# Patient Record
Sex: Female | Born: 1935 | Race: White | Hispanic: No | Smoking: Never smoker
Health system: Southern US, Community
[De-identification: ages and names within clinical notes are randomized; demographics above are authoritative.]

## PROBLEM LIST (undated history)

## (undated) DIAGNOSIS — M1990 Unspecified osteoarthritis, unspecified site: Secondary | ICD-10-CM

## (undated) HISTORY — PX: EYE SURGERY: SHX253

## (undated) HISTORY — PX: ABDOMINAL HYSTERECTOMY: SHX81

## (undated) HISTORY — PX: TOTAL HIP ARTHROPLASTY: SHX124

---

## 2015-11-11 ENCOUNTER — Encounter (HOSPITAL_COMMUNITY): Payer: Self-pay

## 2015-11-11 ENCOUNTER — Emergency Department (HOSPITAL_COMMUNITY): Payer: Medicare Other

## 2015-11-11 ENCOUNTER — Emergency Department (HOSPITAL_COMMUNITY)
Admission: EM | Admit: 2015-11-11 | Discharge: 2015-11-11 | Disposition: A | Discharge status: Home / Self Care | Payer: Medicare Other | Attending: Emergency Medicine | Admitting: Emergency Medicine

## 2015-11-11 DIAGNOSIS — Y92121 Bathroom in nursing home as the place of occurrence of the external cause: Secondary | ICD-10-CM | POA: Diagnosis not present

## 2015-11-11 DIAGNOSIS — W0110XA Fall on same level from slipping, tripping and stumbling with subsequent striking against unspecified object, initial encounter: Secondary | ICD-10-CM | POA: Insufficient documentation

## 2015-11-11 DIAGNOSIS — Z79899 Other long term (current) drug therapy: Secondary | ICD-10-CM | POA: Diagnosis not present

## 2015-11-11 DIAGNOSIS — Z96649 Presence of unspecified artificial hip joint: Secondary | ICD-10-CM | POA: Insufficient documentation

## 2015-11-11 DIAGNOSIS — Y939 Activity, unspecified: Secondary | ICD-10-CM | POA: Diagnosis not present

## 2015-11-11 DIAGNOSIS — Y999 Unspecified external cause status: Secondary | ICD-10-CM | POA: Diagnosis not present

## 2015-11-11 DIAGNOSIS — S0003XA Contusion of scalp, initial encounter: Secondary | ICD-10-CM | POA: Insufficient documentation

## 2015-11-11 DIAGNOSIS — W19XXXA Unspecified fall, initial encounter: Secondary | ICD-10-CM

## 2015-11-11 DIAGNOSIS — S0990XA Unspecified injury of head, initial encounter: Secondary | ICD-10-CM | POA: Diagnosis present

## 2015-11-11 MED ORDER — ACETAMINOPHEN 500 MG PO TABS
1000.0000 mg | ORAL_TABLET | Freq: Once | ORAL | Status: AC
  Administered 2015-11-11: 1000 mg via ORAL
  Filled 2015-11-11: qty 2

## 2015-11-11 NOTE — ED Notes (Signed)
Pt BIB EMS from [facility] on [address]. Pt fell in the bathroom this morning. Pt has hematoma to the back of her head. Pt A&Ox4. Pt remembers everything about the fall. Denies LOC. Denies blood thinners. Pt complaining of pain to her left side. Family at bedside.

## 2015-11-11 NOTE — ED Notes (Signed)
MD at bedside. 

## 2015-11-11 NOTE — ED Notes (Signed)
Bed: [id number] Expected date:  Expected time:  Means of arrival:  Comments: 89 yr fall, hematoma

## 2015-11-11 NOTE — Discharge Instructions (Signed)

## 2015-11-11 NOTE — ED Provider Notes (Signed)
CSN: [id number]     Arrival date & time 11/11/15  0206 History  By signing my name below, I, Hollace Hayward, attest that this documentation has been prepared under the direction and in the presence of Burkley Dech, MD.  Electronically Signed: Hollace Hayward, ED Scribe. 11/11/2015. 3:05 AM.    Chief Complaint  Patient presents with  . Fall   Patient is a 82 y.o. female presenting with fall. The history is provided by the patient. No language interpreter was used.  Fall This is a new problem. The current episode started 12 to 24 hours ago. The problem occurs constantly. The problem has been gradually worsening. Pertinent negatives include no headaches. Nothing aggravates the symptoms. Nothing relieves the symptoms. She has tried nothing for the symptoms.   HPI Comments: Brookley Spitler who was brought in by ambulance is a 82 y.o. female who presents to the Emergency Department complaining of unilateral left sided body s/p fall earlier this morning, approximately 16 hours PTA. Pt states that she did strike her head on the floor. Pt denies LOC, and says she remembers everything about the fall. Pt was using her bathroom when she fell, she states that she had to crawl to her room door to get her nurses. Pt states that she was wearing socks at the time of the fall.  Pt denies being on anticoagulants. Pt is in nursing home.  Pt has a PSHx of a total hip arthroplasty. No OTC medications of home remedies tried PTA.  No past medical history on file. Past Surgical History  Procedure Laterality Date  . Total hip arthroplasty    . Abdominal hysterectomy    . Eye surgery      cataract   Family History  Problem Relation Age of Onset  . Cancer Mother   . Heart attack Father    Social History  Substance Use Topics  . Smoking status: Never Smoker   . Smokeless tobacco: Not on file  . Alcohol Use: No   OB History    No data available     Review of Systems  Musculoskeletal: Positive for  myalgias and arthralgias.  Skin: Negative for wound.  Neurological: Negative for syncope and headaches.  All other systems reviewed and are negative.  Allergies  Albuterol; Inderal; Penicillins; Statins; and Vicodin  Home Medications   Prior to Admission medications   Medication Sig Start Date End Date Taking? Authorizing Provider  acetaminophen (TYLENOL) 500 MG tablet Take 1,000 mg by mouth every 8 (eight) hours as needed for mild pain.   Yes Historical Provider, MD  albuterol (PROVENTIL HFA;VENTOLIN HFA) 108 (90 Base) MCG/ACT inhaler Inhale 2 puffs into the lungs every 6 (six) hours as needed for wheezing or shortness of breath.   Yes Historical Provider, MD  fentaNYL (DURAGESIC - DOSED MCG/HR) 25 MCG/HR patch Place 25 mcg onto the skin every 3 (three) days.   Yes Historical Provider, MD  fluticasone (FLONASE) 50 MCG/ACT nasal spray Place 1 spray into both nostrils daily.   Yes Historical Provider, MD  furosemide (LASIX) 20 MG tablet Take 20 mg by mouth daily as needed for fluid.   Yes Historical Provider, MD  gabapentin (NEURONTIN) 100 MG capsule Take 200 mg by mouth 2 (two) times daily.   Yes Historical Provider, MD  loratadine (CLARITIN) 10 MG tablet Take 10 mg by mouth daily.   Yes Historical Provider, MD  Meth-Hyo-M Bl-Na Phos-Ph Sal (URIBEL) 118 MG CAPS Take 1 capsule by mouth every 8 (eight)  hours as needed (urinary retent  ion).   Yes Historical Provider, MD  mirabegron ER (MYRBETRIQ) 50 MG TB24 tablet Take 50 mg by mouth daily.   Yes Historical Provider, MD  omeprazole (PRILOSEC) 20 MG capsule Take 20 mg by mouth daily.   Yes Historical Provider, MD  polyethylene glycol (MIRALAX / GLYCOLAX) packet Take by mouth daily.   Yes Historical Provider, MD  potassium chloride (K-DUR,KLOR-CON) 10 MEQ tablet Take 10 mEq by mouth daily.   Yes Historical Provider, MD  trimethoprim (TRIMPEX) 100 MG tablet Take 100 mg by mouth daily.   Yes Historical Provider, MD   BP 165/94 mmHg  Pulse 88   Temp(Src) 98.3 F (36.8 C) (Oral)  Resp 16  SpO2 94%   Physical Exam  Constitutional: She is oriented to person, place, and time. She appears well-developed and well-nourished.  HENT:  Head: Normocephalic and atraumatic. Head is without raccoon's eyes and without Battle's sign.  Right Ear: Tympanic membrane normal.  Left Ear: Tympanic membrane normal. No hemotympanum.  Mouth/Throat: Oropharynx is clear and moist. No oropharyngeal exudate.  Pt has a 1 inch hematoma center occiput.   Eyes: Conjunctivae and EOM are normal. Pupils are equal, round, and reactive to light.  Neck: Normal range of motion. Neck supple. No JVD present. No tracheal deviation present.  Cardiovascular: Normal rate, regular rhythm, normal heart sounds and intact distal pulses.  Exam reveals no gallop and no friction rub.   No murmur heard. RRR.   Pulmonary/Chest: Effort normal and breath sounds normal. No stridor. No respiratory distress. She has no wheezes. She has no rales.  Lungs CTA bilaterally. No crepitance of the chest wall.  Abdominal: Soft. Bowel sounds are normal. She exhibits no distension. There is no rebound and no guarding.  Musculoskeletal: Normal range of motion.  No stepoffs of the C spine.   Lymphadenopathy:    She has no cervical adenopathy.  Neurological: She is alert and oriented to person, place, and time. She has normal reflexes. She displays normal reflexes.  Skin: Skin is warm and dry.  Psychiatric: She has a normal mood and affect.  Nursing note and vitals reviewed.   ED Course  Procedures (including critical care time)  DIAGNOSTIC STUDIES: Oxygen Saturation is 94% on RA, adequate by my interpretation.   COORDINATION OF CARE: 2:59 AM-Discussed next steps with pt including DG pelvis. Pt verbalized understanding and is agreeable with the plan.   Labs Review Labs Reviewed - No data to display  Imaging Review No results found. I have personally reviewed and evaluated these  images and lab results as part of my medical decision-making.   EKG Interpretation None      MDM   Final diagnoses:  None    Filed Vitals:   11/11/15 0207 11/11/15 0450  BP: 165/94 139/87  Pulse: 88 83  Temp: 98.3 F (36.8 C)   Resp: 16 16   No results found for this or any previous visit. Ct Head Wo Contrast  11/11/2015  CLINICAL DATA:  Fall while going to the bathroom while wearing socks. Striking head on floor. No loss of consciousness. EXAM: CT HEAD WITHOUT CONTRAST CT CERVICAL SPINE WITHOUT CONTRAST TECHNIQUE: Multidetector CT imaging of the head and cervical spine was performed following the standard protocol without intravenous contrast. Multiplanar CT image reconstructions of the cervical spine were also generated. COMPARISON:  None. FINDINGS: CT HEAD FINDINGS Age-related atrophy. Moderate chronic small vessel ischemia.No intracranial hemorrhage, mass effect, or midline shift. No hydrocephalus. The  basilar cisterns are patent. No evidence of territorial infarct. No intracranial fluid collection. Left posterior scalp injury without subjacent fracture. Calvarium is intact. Included paranasal sinuses and mastoid air cells are well aerated. CT CERVICAL SPINE FINDINGS No fracture or acute subluxation. The dens is intact. There are no jumped or perched facets. Multilevel degenerative disc disease and facet arthropathy. Minimal anterolisthesis of C4 on C5 appears degenerative. No prevertebral soft tissue edema. IMPRESSION: 1. Posterior scalp injury without acute intracranial abnormality or calvarial fracture. 2. Multilevel degenerative change throughout the cervical spine without acute fracture or subluxation. Electronically Signed   By: Rubye OaksMelanie  Ehinger M.D.   On: 11/11/2015 06:00   Ct Cervical Spine Wo Contrast  11/11/2015  CLINICAL DATA:  Fall while going to the bathroom while wearing socks. Striking head on floor. No loss of consciousness. EXAM: CT HEAD WITHOUT CONTRAST CT CERVICAL  SPINE WITHOUT CONTRAST TECHNIQUE: Multidetector CT imaging of the head and cervical spine was performed following the standard protocol without intravenous contrast. Multiplanar CT image reconstructions of the cervical spine were also generated. COMPARISON:  None. FINDINGS: CT HEAD FINDINGS Age-related atrophy. Moderate chronic small vessel ischemia.No intracranial hemorrhage, mass effect, or midline shift. No hydrocephalus. The basilar cisterns are patent. No evidence of territorial infarct. No intracranial fluid collection. Left posterior scalp injury without subjacent fracture. Calvarium is intact. Included paranasal sinuses and mastoid air cells are well aerated. CT CERVICAL SPINE FINDINGS No fracture or acute subluxation. The dens is intact. There are no jumped or perched facets. Multilevel degenerative disc disease and facet arthropathy. Minimal anterolisthesis of C4 on C5 appears degenerative. No prevertebral soft tissue edema. IMPRESSION: 1. Posterior scalp injury without acute intracranial abnormality or calvarial fracture. 2. Multilevel degenerative change throughout the cervical spine without acute fracture or subluxation. Electronically Signed   By: Rubye OaksMelanie  Ehinger M.D.   On: 11/11/2015 06:00   Dg Hip Unilat With Pelvis 2-3 Views Left  11/11/2015  CLINICAL DATA:  Fall at home this morning.  Left hip pain. EXAM: DG HIP (WITH OR WITHOUT PELVIS) 2-3V LEFT COMPARISON:  None. FINDINGS: Bilateral total hip arthroplasties. No prosthesis dislocation. No evidence of periprosthetic fracture, particularly about the left hip. Pubic rami and remainder of the bony pelvis are intact. The bones are diffusely under mineralized. IMPRESSION: Intact bilateral hip prostheses without evidence of acute hardware complication or periprosthetic fracture. Electronically Signed   By: Rubye OaksMelanie  Ehinger M.D.   On: 11/11/2015 05:31    Medications  acetaminophen (TYLENOL) tablet 1,000 mg (1,000 mg Oral Given 11/11/15 16100611)    Close follow up with your pMD  I personally performed the services described in this documentation, which was scribed in my presence. The recorded information has been reviewed and is accurate.        Cy BlamerApril Sarah-Jane Nazario, MD 11/11/15 [phone]

## 2015-11-11 NOTE — ED Notes (Signed)
[facility] called  

## 2016-09-07 ENCOUNTER — Encounter (HOSPITAL_COMMUNITY): Payer: Self-pay | Admitting: Emergency Medicine

## 2016-09-07 ENCOUNTER — Emergency Department (HOSPITAL_COMMUNITY): Payer: Medicare Other

## 2016-09-07 ENCOUNTER — Emergency Department (HOSPITAL_COMMUNITY)
Admission: EM | Admit: 2016-09-07 | Discharge: 2016-09-07 | Disposition: A | Discharge status: Home / Self Care | Payer: Medicare Other | Attending: Emergency Medicine | Admitting: Emergency Medicine

## 2016-09-07 DIAGNOSIS — Z96649 Presence of unspecified artificial hip joint: Secondary | ICD-10-CM | POA: Insufficient documentation

## 2016-09-07 DIAGNOSIS — E860 Dehydration: Secondary | ICD-10-CM | POA: Diagnosis not present

## 2016-09-07 DIAGNOSIS — R531 Weakness: Secondary | ICD-10-CM | POA: Diagnosis present

## 2016-09-07 HISTORY — DX: Unspecified osteoarthritis, unspecified site: M19.90

## 2016-09-07 LAB — CBC WITH DIFFERENTIAL/PLATELET
BASOS ABS: 0 10*3/uL (ref 0.0–0.1)
BASOS PCT: 1 %
EOS PCT: 1 %
Eosinophils Absolute: 0.1 10*3/uL (ref 0.0–0.7)
HCT: 34.8 % — ABNORMAL LOW (ref 36.0–46.0)
Hemoglobin: 11.3 g/dL — ABNORMAL LOW (ref 12.0–15.0)
LYMPHS PCT: 22 %
Lymphs Abs: 1.3 10*3/uL (ref 0.7–4.0)
MCH: 30.1 pg (ref 26.0–34.0)
MCHC: 32.5 g/dL (ref 30.0–36.0)
MCV: 92.8 fL (ref 78.0–100.0)
Monocytes Absolute: 0.7 10*3/uL (ref 0.1–1.0)
Monocytes Relative: 12 %
Neutro Abs: 3.8 10*3/uL (ref 1.7–7.7)
Neutrophils Relative %: 64 %
PLATELETS: 313 10*3/uL (ref 150–400)
RBC: 3.75 MIL/uL — AB (ref 3.87–5.11)
RDW: 13.2 % (ref 11.5–15.5)
WBC: 6 10*3/uL (ref 4.0–10.5)

## 2016-09-07 LAB — URINALYSIS, ROUTINE W REFLEX MICROSCOPIC
BILIRUBIN URINE: NEGATIVE
Bacteria, UA: NONE SEEN
GLUCOSE, UA: NEGATIVE mg/dL
KETONES UR: NEGATIVE mg/dL
LEUKOCYTES UA: NEGATIVE
NITRITE: NEGATIVE
PROTEIN: NEGATIVE mg/dL
SQUAMOUS EPITHELIAL / LPF: NONE SEEN
Specific Gravity, Urine: 1.009 (ref 1.005–1.030)
pH: 7 (ref 5.0–8.0)

## 2016-09-07 LAB — BASIC METABOLIC PANEL
ANION GAP: 8 (ref 5–15)
BUN: 15 mg/dL (ref 6–20)
CO2: 25 mmol/L (ref 22–32)
Calcium: 8.7 mg/dL — ABNORMAL LOW (ref 8.9–10.3)
Chloride: 104 mmol/L (ref 101–111)
Creatinine, Ser: 1.31 mg/dL — ABNORMAL HIGH (ref 0.44–1.00)
GFR, EST AFRICAN AMERICAN: 39 mL/min — AB (ref >60)
GFR, EST NON AFRICAN AMERICAN: 34 mL/min — AB (ref >60)
GLUCOSE: 89 mg/dL (ref 65–99)
POTASSIUM: 4 mmol/L (ref 3.5–5.1)
Sodium: 137 mmol/L (ref 135–145)

## 2016-09-07 LAB — I-STAT TROPONIN, ED: Troponin i, poc: 0 ng/mL (ref 0.00–0.08)

## 2016-09-07 MED ORDER — ONDANSETRON HCL 4 MG/2ML IJ SOLN
4.0000 mg | Freq: Once | INTRAMUSCULAR | Status: AC
  Administered 2016-09-07: 4 mg via INTRAVENOUS
  Filled 2016-09-07: qty 2

## 2016-09-07 MED ORDER — SODIUM CHLORIDE 0.9 % IV BOLUS (SEPSIS)
1000.0000 mL | Freq: Once | INTRAVENOUS | Status: AC
  Administered 2016-09-07: 1000 mL via INTRAVENOUS

## 2016-09-07 NOTE — ED Triage Notes (Addendum)
Per EMS, pt is from home where she lives with family with complaints of weakness. Pt had the stomach flu last week and is currently being treated for a UTI. Family reports that pt has been weak since the stomach flu and pt states "I am wiped out". Pt is AO x4 and ambulates independently with a walker.

## 2016-09-07 NOTE — ED Notes (Signed)
Family states pt had periods of confusion throughout the night.

## 2016-09-07 NOTE — ED Notes (Signed)
Bed: [id number] Expected date:  Expected time:  Means of arrival:  Comments: Held 93-weakness

## 2016-09-07 NOTE — ED Provider Notes (Signed)
[facility]-EMERGENCY DEPT Provider Note   CSN: [id number] Arrival date & time: 09/07/16  1136     History   Chief Complaint No chief complaint on file.   HPI Monica FothergillChristine Ruth Ramirez is a 83 y.o. female.  The history is provided by the patient and medical records.     83 year old female with history of arthritis, recurrent UTI, here with generalized weakness. Daughter reports patient got sick with a GI bug last week that she caught from her son who lives with her. States she had several days of nausea and vomiting, was seen by PCP and started on antibiotics with some improvement. Her appetite has returned somewhat, still eating small amounts. Daughter has been trying to keep her on a bland diet to prevent recurrent nausea. States towards the end of last week she started having some incontinence issues and was seen by urologist and diagnosed with UTI. She had 3 days worth of IM antibiotics and was started on Bactrim. This seems to be improved, but son reports patient was up about 6 or 7 times last night going to the bathroom. He also reports she was talking in her sleep. Patient denies any pain at present. States she just feels "wiped out". She's not had any falls or syncopal events. States she has been trying to rest more than normal when daughter reports she has been up and active less than normal. She is ambulatory with walker at baseline. Daughter did call patient's PCP today was encouraged to come here, likely needing IV fluids. Patient does report her bowels have not moved in a few days, but thinks this is because she has not really been eating very much. Daughter did give her some miralax this morning.  She denies any abdominal pain. No chest pain or shortness of breath. No medication changes aside from antibiotics.  No URI symptoms, fever, chills.  Past Medical History:  Diagnosis Date  . Arthritis     There are no active problems to display for this patient.   Past Surgical History:    Procedure Laterality Date  . ABDOMINAL HYSTERECTOMY    . EYE SURGERY     cataract  . TOTAL HIP ARTHROPLASTY      OB History    No data available       Home Medications    Prior to Admission medications   Medication Sig Start Date End Date Taking? Authorizing Provider  acetaminophen (TYLENOL) 500 MG tablet Take 1,000 mg by mouth every 8 (eight) hours as needed for mild pain.    Historical Provider, MD  albuterol (PROVENTIL HFA;VENTOLIN HFA) 108 (90 Base) MCG/ACT inhaler Inhale 2 puffs into the lungs every 6 (six) hours as needed for wheezing or shortness of breath.    Historical Provider, MD  fentaNYL (DURAGESIC - DOSED MCG/HR) 25 MCG/HR patch Place 25 mcg onto the skin every 3 (three) days.    Historical Provider, MD  fluticasone (FLONASE) 50 MCG/ACT nasal spray Place 1 spray into both nostrils daily.    Historical Provider, MD  furosemide (LASIX) 20 MG tablet Take 20 mg by mouth daily as needed for fluid.    Historical Provider, MD  gabapentin (NEURONTIN) 100 MG capsule Take 200 mg by mouth 2 (two) times daily.    Historical Provider, MD  loratadine (CLARITIN) 10 MG tablet Take 10 mg by mouth daily.    Historical Provider, MD  Meth-Hyo-M Bl-Na Phos-Ph Sal (URIBEL) 118 MG CAPS Take 1 capsule by mouth every 8 (eight) hours as needed (  urinary retent  ion).    Historical Provider, MD  mirabegron ER (MYRBETRIQ) 50 MG TB24 tablet Take 50 mg by mouth daily.    Historical Provider, MD  omeprazole (PRILOSEC) 20 MG capsule Take 20 mg by mouth daily.    Historical Provider, MD  polyethylene glycol (MIRALAX / GLYCOLAX) packet Take by mouth daily.    Historical Provider, MD  potassium chloride (K-DUR,KLOR-CON) 10 MEQ tablet Take 10 mEq by mouth daily.    Historical Provider, MD  trimethoprim (TRIMPEX) 100 MG tablet Take 100 mg by mouth daily.    Historical Provider, MD    Family History Family History  Problem Relation Age of Onset  . Cancer Mother   . Heart attack Father     Social  History Social History  Substance Use Topics  . Smoking status: Never Smoker  . Smokeless tobacco: Not on file  . Alcohol use No     Allergies   Albuterol; Inderal [propranolol]; Penicillins; Statins; and Vicodin [hydrocodone-acetaminophen]   Review of Systems Review of Systems  Neurological: Positive for weakness.  All other systems reviewed and are negative.    Physical Exam Updated Vital Signs BP 128/61 (BP Location: Left Arm)   Pulse 66   Temp 99.1 F (37.3 C) (Oral)   Resp 18   SpO2 95%   Physical Exam  Constitutional: She is oriented to person, place, and time. She appears well-developed and well-nourished.  No acute distress, appears well for her age  HENT:  Head: Normocephalic and atraumatic.  Mouth/Throat: Oropharynx is clear and moist.  Dry mucous membranes  Eyes: Conjunctivae and EOM are normal. Pupils are equal, round, and reactive to light.  Neck: Normal range of motion.  Cardiovascular: Normal rate, regular rhythm and normal heart sounds.   Pulmonary/Chest: Effort normal and breath sounds normal. She has no decreased breath sounds. She has no wheezes. She has no rhonchi.  Abdominal: Soft. Bowel sounds are normal.  Musculoskeletal: Normal range of motion.  Neurological: She is alert and oriented to person, place, and time.  Skin: Skin is warm and dry.  Poor skin turgor  Psychiatric: She has a normal mood and affect.  Nursing note and vitals reviewed.    ED Treatments / Results  Labs (all labs ordered are listed, but only abnormal results are displayed) Labs Reviewed  CBC WITH DIFFERENTIAL/PLATELET - Abnormal; Notable for the following:       Result Value   RBC 3.75 (*)    Hemoglobin 11.3 (*)    HCT 34.8 (*)    All other components within normal limits  BASIC METABOLIC PANEL - Abnormal; Notable for the following:    Creatinine, Ser 1.31 (*)    Calcium 8.7 (*)    GFR calc non Af Amer 34 (*)    GFR calc Af Amer 39 (*)    All other components  within normal limits  URINALYSIS, ROUTINE W REFLEX MICROSCOPIC - Abnormal; Notable for the following:    Color, Urine STRAW (*)    Hgb urine dipstick SMALL (*)    All other components within normal limits  URINE CULTURE  I-STAT TROPOININ, ED    EKG  EKG Interpretation  Date/Time:  Wednesday September 07 2016 14:05:47 EDT Ventricular Rate:  69 PR Interval:    QRS Duration: 140 QT Interval:  433 QTC Calculation: 464 R Axis:   -71 Text Interpretation:  Sinus rhythm Short PR interval RBBB and LAFB Confirmed by Ranae Palms  MD, DAVID ([id number]) on 09/07/2016 4:27:53 PM  Radiology Dg Abd Acute W/chest  Result Date: 09/07/2016 CLINICAL DATA:  Weakness.  Urinary tract infection.  Constipation EXAM: DG ABDOMEN ACUTE W/ 1V CHEST COMPARISON:  None. FINDINGS: PA chest: No edema or consolidation. Heart is mildly enlarged with pulmonary vascularity normal. No adenopathy. There is atherosclerotic calcification aorta. Aorta is somewhat tortuous. There is degenerative change in both shoulders with evidence of avascular necrosis in each humeral head. Supine and upright abdomen: Supine and left lateral decubitus images were obtained. There is diffuse stool throughout the colon. There is no bowel dilatation or air-fluid levels suggesting bowel obstruction. No evident free air. Patient is status post total hip replacements bilaterally. There is atherosclerotic calcification in the aorta and splenic artery. Calcifications are also noted in each common iliac artery. IMPRESSION: Diffuse stool throughout colon consistent with a degree of constipation. No bowel obstruction or free air. Extensive aortoiliac atherosclerosis. Total hip replacements bilaterally. No lung edema or consolidation. Mild cardiac enlargement. Avascular necrosis in each humeral head. Electronically Signed   By: Bretta Bang III M.D.   On: 09/07/2016 14:05    Procedures Procedures (including critical care time)  Medications Ordered in  ED Medications  sodium chloride 0.9 % bolus 1,000 mL (1,000 mLs Intravenous New Bag/Given 09/07/16 1417)  ondansetron (ZOFRAN) injection 4 mg (4 mg Intravenous Given 09/07/16 1442)     Initial Impression / Assessment and Plan / ED Course  I have reviewed the triage vital signs and the nursing notes.  Pertinent labs & imaging results that were available during my care of the patient were reviewed by me and considered in my medical decision making (see chart for details).  83 year old female here with generalized weakness. Recent stomach flu as well as UTI. Here she is afebrile and nontoxic. She is awake, alert, and appropriately oriented to baseline. Her exam is overall noninfectious. Vitals are stable. She does appear somewhat clinically dry. Basic labs obtained in her overall reassuring. UA without signs of infection today, culture pending given recent UTI. Acute abdominal series with degree of constipation, but no acute obstructive process or signs of pneumonia. After IV fluids here patient reports feeling better.  She has not had any recurrent nausea/vomiting.  VS remain stable.  Feel she is stable for discharge.  Patient and daughter are comfortable with discharge home.  I recommended to continue oral hydration as well as MiraLAX for constipation. She will finish her course of Bactrim as prescribed by urology, then resume her daily antibiotics of Keflex which she takes for prophylaxis. She follow-up with her PCP within the next week for recheck. She will return here for any new or worsening symptoms including worsening weakness, falls, syncope, trouble tolerating fluids, abdominal pain, fever, etc. Patient and daughter at the bedside both acknowledged understanding and agreed with care plan.  Case discussed with attending physician, Dr. Ranae Palms, who evaluated patient and agrees with plan of care.  Final Clinical Impressions(s) / ED Diagnoses   Final diagnoses:  Generalized weakness    New  Prescriptions Discharge Medication List as of 09/07/2016  4:44 PM       Garlon Hatchet, PA-C 09/07/16 1752    Loren Racer, MD 09/08/16 [phone]

## 2016-09-07 NOTE — ED Notes (Signed)
Pt wheeled to family vehicle.  Pt and family verbalized understanding of discharge instructions.

## 2016-09-07 NOTE — Discharge Instructions (Signed)
Lab work looked pretty good today.  Urine infection looks as if it has cleared, however would finish the course of bactrim as prescribed. X-ray of the belly did not show constipation-- can continue miralax for this. Continue oral hydration at home. Follow-up with PCP within the next week for recheck. Return here for new concerns-- worsening weakness, syncopal events, falls, abdominal pain, fever, chills, etc.

## 2016-09-08 LAB — URINE CULTURE: Culture: NO GROWTH

## 2016-12-10 ENCOUNTER — Encounter (HOSPITAL_COMMUNITY): Payer: Self-pay | Admitting: Emergency Medicine

## 2016-12-10 ENCOUNTER — Emergency Department (HOSPITAL_COMMUNITY): Payer: Medicare Other

## 2016-12-10 ENCOUNTER — Inpatient Hospital Stay (HOSPITAL_COMMUNITY)
Admission: EM | Admit: 2016-12-10 | Discharge: 2017-01-11 | DRG: 064 | Disposition: E | Payer: Medicare Other | Attending: Neurology | Admitting: Neurology

## 2016-12-10 DIAGNOSIS — G911 Obstructive hydrocephalus: Secondary | ICD-10-CM | POA: Diagnosis present

## 2016-12-10 DIAGNOSIS — Z885 Allergy status to narcotic agent status: Secondary | ICD-10-CM | POA: Diagnosis not present

## 2016-12-10 DIAGNOSIS — Z9911 Dependence on respirator [ventilator] status: Secondary | ICD-10-CM

## 2016-12-10 DIAGNOSIS — I629 Nontraumatic intracranial hemorrhage, unspecified: Secondary | ICD-10-CM | POA: Diagnosis not present

## 2016-12-10 DIAGNOSIS — R4189 Other symptoms and signs involving cognitive functions and awareness: Secondary | ICD-10-CM

## 2016-12-10 DIAGNOSIS — Z96649 Presence of unspecified artificial hip joint: Secondary | ICD-10-CM | POA: Diagnosis present

## 2016-12-10 DIAGNOSIS — G936 Cerebral edema: Secondary | ICD-10-CM | POA: Diagnosis present

## 2016-12-10 DIAGNOSIS — R402312 Coma scale, best motor response, none, at arrival to emergency department: Secondary | ICD-10-CM | POA: Diagnosis present

## 2016-12-10 DIAGNOSIS — R29729 NIHSS score 29: Secondary | ICD-10-CM | POA: Diagnosis present

## 2016-12-10 DIAGNOSIS — R402212 Coma scale, best verbal response, none, at arrival to emergency department: Secondary | ICD-10-CM | POA: Diagnosis present

## 2016-12-10 DIAGNOSIS — I161 Hypertensive emergency: Secondary | ICD-10-CM

## 2016-12-10 DIAGNOSIS — Z66 Do not resuscitate: Secondary | ICD-10-CM | POA: Diagnosis present

## 2016-12-10 DIAGNOSIS — Z881 Allergy status to other antibiotic agents status: Secondary | ICD-10-CM | POA: Diagnosis not present

## 2016-12-10 DIAGNOSIS — G8194 Hemiplegia, unspecified affecting left nondominant side: Secondary | ICD-10-CM | POA: Diagnosis present

## 2016-12-10 DIAGNOSIS — I10 Essential (primary) hypertension: Secondary | ICD-10-CM | POA: Diagnosis present

## 2016-12-10 DIAGNOSIS — Z8249 Family history of ischemic heart disease and other diseases of the circulatory system: Secondary | ICD-10-CM

## 2016-12-10 DIAGNOSIS — R402112 Coma scale, eyes open, never, at arrival to emergency department: Secondary | ICD-10-CM | POA: Diagnosis present

## 2016-12-10 DIAGNOSIS — Z880 Allergy status to penicillin: Secondary | ICD-10-CM

## 2016-12-10 DIAGNOSIS — I615 Nontraumatic intracerebral hemorrhage, intraventricular: Secondary | ICD-10-CM | POA: Diagnosis present

## 2016-12-10 DIAGNOSIS — Z90710 Acquired absence of both cervix and uterus: Secondary | ICD-10-CM | POA: Diagnosis not present

## 2016-12-10 DIAGNOSIS — R29732 NIHSS score 32: Secondary | ICD-10-CM | POA: Diagnosis not present

## 2016-12-10 DIAGNOSIS — G919 Hydrocephalus, unspecified: Secondary | ICD-10-CM | POA: Diagnosis not present

## 2016-12-10 DIAGNOSIS — I610 Nontraumatic intracerebral hemorrhage in hemisphere, subcortical: Secondary | ICD-10-CM

## 2016-12-10 DIAGNOSIS — J9690 Respiratory failure, unspecified, unspecified whether with hypoxia or hypercapnia: Secondary | ICD-10-CM | POA: Diagnosis present

## 2016-12-10 DIAGNOSIS — Z515 Encounter for palliative care: Secondary | ICD-10-CM | POA: Diagnosis not present

## 2016-12-10 DIAGNOSIS — W19XXXA Unspecified fall, initial encounter: Secondary | ICD-10-CM | POA: Diagnosis present

## 2016-12-10 DIAGNOSIS — Z888 Allergy status to other drugs, medicaments and biological substances status: Secondary | ICD-10-CM | POA: Diagnosis not present

## 2016-12-10 LAB — CBC WITH DIFFERENTIAL/PLATELET
Basophils Absolute: 0.1 10*3/uL (ref 0.0–0.1)
Basophils Relative: 1 %
Eosinophils Absolute: 0.3 10*3/uL (ref 0.0–0.7)
Eosinophils Relative: 4 %
HCT: 37.4 % (ref 36.0–46.0)
Hemoglobin: 12.1 g/dL (ref 12.0–15.0)
Lymphocytes Relative: 45 %
Lymphs Abs: 3.7 10*3/uL (ref 0.7–4.0)
MCH: 31.2 pg (ref 26.0–34.0)
MCHC: 32.4 g/dL (ref 30.0–36.0)
MCV: 96.4 fL (ref 78.0–100.0)
Monocytes Absolute: 0.4 10*3/uL (ref 0.1–1.0)
Monocytes Relative: 5 %
Neutro Abs: 3.6 10*3/uL (ref 1.7–7.7)
Neutrophils Relative %: 45 %
Platelets: 268 10*3/uL (ref 150–400)
RBC: 3.88 MIL/uL (ref 3.87–5.11)
RDW: 13.6 % (ref 11.5–15.5)
WBC: 8 10*3/uL (ref 4.0–10.5)

## 2016-12-10 LAB — I-STAT CHEM 8, ED
BUN: 21 mg/dL — AB (ref 6–20)
CALCIUM ION: 1.12 mmol/L — AB (ref 1.15–1.40)
CHLORIDE: 106 mmol/L (ref 101–111)
Creatinine, Ser: 0.8 mg/dL (ref 0.44–1.00)
Glucose, Bld: 129 mg/dL — ABNORMAL HIGH (ref 65–99)
HEMATOCRIT: 37 % (ref 36.0–46.0)
Hemoglobin: 12.6 g/dL (ref 12.0–15.0)
POTASSIUM: 3.2 mmol/L — AB (ref 3.5–5.1)
Sodium: 143 mmol/L (ref 135–145)
TCO2: 27 mmol/L (ref 0–100)

## 2016-12-10 LAB — I-STAT ARTERIAL BLOOD GAS, ED
Bicarbonate: 26.3 mmol/L (ref 20.0–28.0)
O2 Saturation: 99 %
PH ART: 7.322 — AB (ref 7.350–7.450)
TCO2: 28 mmol/L (ref 0–100)
pCO2 arterial: 50.9 mmHg — ABNORMAL HIGH (ref 32.0–48.0)
pO2, Arterial: 178 mmHg — ABNORMAL HIGH (ref 83.0–108.0)

## 2016-12-10 LAB — COMPREHENSIVE METABOLIC PANEL
ALT: 8 U/L — ABNORMAL LOW (ref 14–54)
AST: 24 U/L (ref 15–41)
Albumin: 3.9 g/dL (ref 3.5–5.0)
Alkaline Phosphatase: 75 U/L (ref 38–126)
Anion gap: 9 (ref 5–15)
BUN: 18 mg/dL (ref 6–20)
CO2: 24 mmol/L (ref 22–32)
Calcium: 8.8 mg/dL — ABNORMAL LOW (ref 8.9–10.3)
Chloride: 108 mmol/L (ref 101–111)
Creatinine, Ser: 0.88 mg/dL (ref 0.44–1.00)
GFR calc Af Amer: >60 mL/min (ref >60)
GFR calc non Af Amer: 55 mL/min — ABNORMAL LOW (ref >60)
Glucose, Bld: 126 mg/dL — ABNORMAL HIGH (ref 65–99)
Potassium: 3.3 mmol/L — ABNORMAL LOW (ref 3.5–5.1)
Sodium: 141 mmol/L (ref 135–145)
Total Bilirubin: 0.6 mg/dL (ref 0.3–1.2)
Total Protein: 7.1 g/dL (ref 6.5–8.1)

## 2016-12-10 LAB — MRSA PCR SCREENING: MRSA by PCR: NEGATIVE

## 2016-12-10 LAB — I-STAT TROPONIN, ED: Troponin i, poc: 0 ng/mL (ref 0.00–0.08)

## 2016-12-10 LAB — GLUCOSE, CAPILLARY: Glucose-Capillary: 125 mg/dL — ABNORMAL HIGH (ref 65–99)

## 2016-12-10 LAB — CBG MONITORING, ED: Glucose-Capillary: 127 mg/dL — ABNORMAL HIGH (ref 65–99)

## 2016-12-10 LAB — TROPONIN I: Troponin I: <0.03 ng/mL (ref <0.03)

## 2016-12-10 MED ORDER — SUCCINYLCHOLINE CHLORIDE 20 MG/ML IJ SOLN
INTRAMUSCULAR | Status: DC | PRN
  Administered 2016-12-10: 80 mg via INTRAVENOUS

## 2016-12-10 MED ORDER — PANTOPRAZOLE SODIUM 40 MG IV SOLR
40.0000 mg | Freq: Every day | INTRAVENOUS | Status: DC
  Administered 2016-12-10: 40 mg via INTRAVENOUS
  Filled 2016-12-10: qty 40

## 2016-12-10 MED ORDER — ONDANSETRON HCL 4 MG PO TABS: 4.0000 mg | ORAL_TABLET | Freq: Three times a day (TID) | ORAL | Status: DC | PRN

## 2016-12-10 MED ORDER — FAMOTIDINE IN NACL 20-0.9 MG/50ML-% IV SOLN: 20.0000 mg | Freq: Two times a day (BID) | INTRAVENOUS | Status: DC

## 2016-12-10 MED ORDER — SODIUM CHLORIDE 0.9 % IV SOLN
0.0000 mg/h | INTRAVENOUS | Status: DC
  Administered 2016-12-10: 1 mg/h via INTRAVENOUS
  Administered 2016-12-12: 10 mg/h via INTRAVENOUS
  Filled 2016-12-10 (×2): qty 10

## 2016-12-10 MED ORDER — NICARDIPINE HCL IN NACL 20-0.86 MG/200ML-% IV SOLN: 3.0000 mg/h | Freq: Once | INTRAVENOUS | Status: DC

## 2016-12-10 MED ORDER — SODIUM CHLORIDE 0.9 % IV SOLN
250.0000 mL | INTRAVENOUS | Status: DC | PRN
Start: 2016-12-10 — End: 2016-12-12

## 2016-12-10 MED ORDER — MORPHINE BOLUS VIA INFUSION
2.0000 mg | INTRAVENOUS | Status: DC | PRN
  Administered 2016-12-11: 2 mg via INTRAVENOUS
  Administered 2016-12-12: 4 mg via INTRAVENOUS
  Administered 2016-12-12 (×2): 2 mg via INTRAVENOUS
  Filled 2016-12-10: qty 4

## 2016-12-10 MED ORDER — SENNOSIDES-DOCUSATE SODIUM 8.6-50 MG PO TABS
1.0000 | ORAL_TABLET | Freq: Two times a day (BID) | ORAL | Status: DC
  Filled 2016-12-10: qty 1

## 2016-12-10 MED ORDER — KCL IN DEXTROSE-NACL 40-5-0.45 MEQ/L-%-% IV SOLN
INTRAVENOUS | Status: DC
  Administered 2016-12-10: 13:00:00 via INTRAVENOUS
  Filled 2016-12-10: qty 1000

## 2016-12-10 MED ORDER — ONDANSETRON HCL 4 MG/2ML IJ SOLN: 4.0000 mg | Freq: Four times a day (QID) | INTRAMUSCULAR | Status: DC | PRN

## 2016-12-10 MED ORDER — STROKE: EARLY STAGES OF RECOVERY BOOK
Freq: Once | Status: AC
  Administered 2016-12-10: 12:00:00
  Filled 2016-12-10: qty 1

## 2016-12-10 MED ORDER — CLEVIDIPINE BUTYRATE 0.5 MG/ML IV EMUL
0.0000 mg/h | INTRAVENOUS | Status: DC
  Administered 2016-12-10 (×2): 2 mg/h via INTRAVENOUS
  Filled 2016-12-10: qty 50

## 2016-12-10 MED ORDER — MANNITOL 25 % IV SOLN
50.0000 g | Freq: Once | INTRAVENOUS | Status: AC
  Administered 2016-12-10: 50 g via INTRAVENOUS
  Filled 2016-12-10: qty 200

## 2016-12-10 MED ORDER — ETOMIDATE 2 MG/ML IV SOLN
INTRAVENOUS | Status: DC | PRN
  Administered 2016-12-10: 20 mg via INTRAVENOUS

## 2016-12-10 MED ORDER — POTASSIUM CHLORIDE IN NACL 20-0.9 MEQ/L-% IV SOLN
INTRAVENOUS | Status: DC
  Administered 2016-12-10: 15:00:00 via INTRAVENOUS
  Filled 2016-12-10: qty 1000

## 2016-12-10 MED ORDER — INSULIN ASPART 100 UNIT/ML ~~LOC~~ SOLN: 2.0000 [IU] | SUBCUTANEOUS | Status: DC

## 2016-12-10 NOTE — Progress Notes (Signed)
Code stroke called at 1033, patient arrived to [facility] via [facility] at 053.  As per EMS, LSN 1000, family heard her fall in bathroom, was noted to have facial droop and  Left weakness and was still alert, when EMS arrived patient became unresponsive, pupils unequal and hypertensive 200/120's. Patient was intubated in ER. Not responding to noxious stimuli, no corneals or gag on arrival. cleviprex started for blood pressure control

## 2016-12-10 NOTE — Progress Notes (Signed)
Chaplain responded to page to ED for Code stroke.  Patient was intubated and family was present at the bedside.  Chaplain offered prayer and support to family during time of need.  Patient was transferred to 4N27. Chaplain escorted family to 4th floor waiting room and notified nurse that they were waiting.  Family minister arrived to be with the family.  Chaplain advised that she would be present if needed all day and the location of the hospital chapel.

## 2016-12-10 NOTE — Progress Notes (Signed)
eLink Physician-Brief Progress Note Patient Name: Monica Ramirez DOB: 1920/12/22 MRN: [id number]   Date of Service  11/15/2016  HPI/Events of Note    eICU Interventions  Comfort care orders given based on prior discussions     Intervention Category Major Interventions: End of life / care limitation discussion  Lathen Seal V. 12/08/2016, 11:30 PM

## 2016-12-10 NOTE — ED Notes (Signed)
CBG 127; RN aware

## 2016-12-10 NOTE — H&P (Signed)
Admission H&P  Monica Ramirez is an 83 y.o. female.   Chief Complaint: Unconscious and left sided paralysis HPI: Elderly woman who was witnessed to fall and become unconscious.  BP 240/120's on arrival.  CT Brain shows large right thalamic/midbrain ICH with right to left shift and intraventricular extension and early hydrocephalus.    Past Medical History:  Diagnosis Date  . Arthritis     Past Surgical History:  Procedure Laterality Date  . ABDOMINAL HYSTERECTOMY    . EYE SURGERY     cataract  . TOTAL HIP ARTHROPLASTY      Family History  Problem Relation Age of Onset  . Cancer Mother   . Heart attack Father    Social History:  reports that she has never smoked. She does not have any smokeless tobacco history on file. She reports that she does not drink alcohol. Her drug history is not on file.  Allergies:  Allergies  Allergen Reactions  . Cefdinir     Unknown reaction   . Inderal [Propranolol] Other (See Comments)    Made patient out of it  . Levaquin [Levofloxacin] Other (See Comments)    Dizzy   . Penicillins     Unknown reaction  Has patient had a PCN reaction causing immediate rash, facial/tongue/throat swelling, SOB or lightheadedness with hypotension: unknown Has patient had a PCN reaction causing severe rash involving mucus membranes or skin necrosis: unknown Has patient had a PCN reaction that required hospitalization: unknown Has patient had a PCN reaction occurring within the last 10 years: unknown If all of the above answers are "NO", then may proceed with Cephalosporin use.   . Statins     Unknown reaction   . Vicodin [Hydrocodone-Acetaminophen]     Made patient fall      (Not in a hospital admission)  Results for orders placed or performed during the hospital encounter of 11/25/2016 (from the past 48 hour(s))  CBG monitoring, ED     Status: Abnormal   Collection Time: 11/27/2016 10:55 AM  Result Value Ref Range   Glucose-Capillary 127 (H) 65 -  99 mg/dL  CBC with Differential     Status: None   Collection Time: 12/08/2016 10:57 AM  Result Value Ref Range   WBC 8.0 4.0 - 10.5 K/uL   RBC 3.88 3.87 - 5.11 MIL/uL   Hemoglobin 12.1 12.0 - 15.0 g/dL   HCT 16.1 09.6 - 04.5 %   MCV 96.4 78.0 - 100.0 fL   MCH 31.2 26.0 - 34.0 pg   MCHC 32.4 30.0 - 36.0 g/dL   RDW 40.9 81.1 - 91.4 %   Platelets 268 150 - 400 K/uL   Neutrophils Relative % 45 %   Neutro Abs 3.6 1.7 - 7.7 K/uL   Lymphocytes Relative 45 %   Lymphs Abs 3.7 0.7 - 4.0 K/uL   Monocytes Relative 5 %   Monocytes Absolute 0.4 0.1 - 1.0 K/uL   Eosinophils Relative 4 %   Eosinophils Absolute 0.3 0.0 - 0.7 K/uL   Basophils Relative 1 %   Basophils Absolute 0.1 0.0 - 0.1 K/uL  I-stat troponin, ED     Status: None   Collection Time: 12/07/2016 11:01 AM  Result Value Ref Range   Troponin i, poc 0.00 0.00 - 0.08 ng/mL   Comment 3            Comment: Due to the release kinetics of cTnI, a negative result within the first hours of the onset of  symptoms does not rule out myocardial infarction with certainty. If myocardial infarction is still suspected, repeat the test at appropriate intervals.   I-stat chem 8, ed     Status: Abnormal   Collection Time: 11/14/2016 11:02 AM  Result Value Ref Range   Sodium 143 135 - 145 mmol/L   Potassium 3.2 (L) 3.5 - 5.1 mmol/L   Chloride 106 101 - 111 mmol/L   BUN 21 (H) 6 - 20 mg/dL   Creatinine, Ser 0.450.80 0.44 - 1.00 mg/dL   Glucose, Bld 409129 (H) 65 - 99 mg/dL   Calcium, Ion 8.111.12 (L) 1.15 - 1.40 mmol/L   TCO2 27 0 - 100 mmol/L   Hemoglobin 12.6 12.0 - 15.0 g/dL   HCT 91.437.0 78.236.0 - 95.646.0 %   Dg Chest Portable 1 View  Result Date: 12/05/2016 CLINICAL DATA:  Status post intubation. EXAM: PORTABLE CHEST 1 VIEW COMPARISON:  09/07/2016 FINDINGS: There has been interval intubation with endotracheal tube terminating in the right mainstem bronchus. Retraction by 4.5 cm is recommended. Cardiomediastinal silhouette is normal. Mediastinal contours appear  intact. Calcific atherosclerotic disease of the aorta. There is no evidence of focal airspace consolidation, pleural effusion or pneumothorax. Low lung volumes. Chronic interstitial lung changes. Osseous structures are without acute abnormality. 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 Soft tissues are grossly normal. IMPRESSION: Interval right mainstem bronchus intubation. Retraction of the endotracheal tube by 4.5 cm is recommended. No evidence of pneumothorax. Low lung volumes. Chronic interstitial lung changes. These results were called by telephone at the time of interpretation on 12/03/2016 at 11:23 am to Dr. Raeford RazorSTEPHEN KOHUT , who verbally acknowledged these results. Electronically Signed   By: Ted Mcalpineobrinka  Dimitrova M.D.   On: 11/26/2016 11:22   Ct Head Code Stroke Wo Contrast  Result Date: 11/14/2016 CLINICAL DATA:  Code stroke. Left-sided paralysis. Unequal pupils. Unresponsive. EXAM: CT HEAD WITHOUT CONTRAST TECHNIQUE: Contiguous axial images were obtained from the base of the skull through the vertex without intravenous contrast. COMPARISON:  11/11/2015 FINDINGS: Brain: Acute right thalamic intraparenchymal hematoma measuring 4.5 x 3.2 x 3.3 cm (volume = 25 cm^3) with extension into the right cerebral peduncle and mid brain and with intraventricular penetration. Moderate amount of blood in the lateral ventricles. Filling of the third ventricle. Large amount of blood in the fourth ventricle. Possible acute ventricular enlargement. Right-to-left midline shift of 4 mm. Background pattern elsewhere brain atrophy and chronic small vessel disease. Vascular: There is atherosclerotic calcification of the major vessels at the base of the brain. Skull: Normal Sinuses/Orbits: Clear/ normal Other: None significant IMPRESSION: 1. Acute right thalamic intraparenchymal hemorrhage with hematoma measuring 4.5 x 3.2 x 3.3 cm, showing extension into the right cerebral peduncle and midbrain and  intraventricular penetration, probably with ventricular dilatation. 2. Critical Value/emergent results were called by telephone at the time of interpretation on 11/29/2016 at 11:35 am to Dr. Benay SpiceAshraghi, who verbally acknowledged these results. Electronically Signed   By: Paulina FusiMark  Shogry M.D.   On: 12/03/2016 11:38    ROS  Physical Examination: SpO2 99 %.  HEENT - Normocephalic, without obvious abnormality, atraumatic, conjunctivae/corneas clear. PERRL, EOM's intact. Fundi benign., normal TM's and external ear canals both ears, normal, normal and no erythema or exudates noted. Teeth and gums normal. Cardiovascular - regular rate and rhythm Lungs - chest clear, no wheezing, rales, normal symmetric air entry, Heart exam - S1, S2 normal, no murmur, no gallop, rate regular Abdomen - soft, non-tender; bowel sounds normal; no masses,  no  organomegaly Extremities - less then 2 second capillary refill  Neurologic Examination:  Unresponsive.  Right pupil large and unresponsive.  Left pupil small and responsive.  Eyes midline.  Reduced withdrawal to pain on the left.  Normal withdrawal on the right.    A/P: Hypertensive intracranial hemorrhage in the right thalamus/midbrain with intraventricular extension and shift.  I will put her on a Clevidipine titrating 1-21 mg/hr for SBP <140 and DBP < 90.    Prognosis is very poor with an ICH score 5 indicating 100% risk of mortality in 30 days.  No neurosurgical intervention will be pursued.    I will discuss with family when they are here.    Monica Celli,MD 2017-01-03 11:47 AM

## 2016-12-10 NOTE — Consult Note (Signed)
PULMONARY / CRITICAL CARE MEDICINE   Name: Monica Ramirez MRN: [id number] DOB: 09-Oct-1920    ADMISSION DATE:  11/20/2016  REFERRING MD:  Dr Amparo Bristol  CHIEF COMPLAINT:  Left sided weakness, slurring of speech  HISTORY OF PRESENT ILLNESS:    This is a 83 year old female who presented with "stroke. History obtained from patient's son. Patient today morning around 10:30 went to the bathroom and then called for help. When the patient's son went to the bathroom he found her laying on the floor. She had sustained a fall but was awake and was having slurring of speech. She was pointing to the right side of her head showing where she bumped her head. She was also having left-sided weakness. They called EMS and patient was brought to ER and intubated for airway protection.  She was also noted to have irregular pupils in the ER. She had a CT head done which showed right thalamic midbrain intracranial hemorrhage with right to left shift and intraventricular extension.   History entirely obtained from ER physician and patient's family members.  Patient was functional up to yesterday where she was lucid and could move around herself without help.  PAST MEDICAL HISTORY :  She  has a past medical history of Arthritis.  PAST SURGICAL HISTORY: She  has a past surgical history that includes Total hip arthroplasty; Abdominal hysterectomy; and Eye surgery.  Allergies  Allergen Reactions  . Cefdinir     Unknown reaction   . Inderal [Propranolol] Other (See Comments)    Made patient out of it  . Levaquin [Levofloxacin] Other (See Comments)    Dizzy   . Penicillins     Unknown reaction  Has patient had a PCN reaction causing immediate rash, facial/tongue/throat swelling, SOB or lightheadedness with hypotension: unknown Has patient had a PCN reaction causing severe rash involving mucus membranes or skin necrosis: unknown Has patient had a PCN reaction that required hospitalization: unknown Has  patient had a PCN reaction occurring within the last 10 years: unknown If all of the above answers are "NO", then may proceed with Cephalosporin use.   . Statins     Unknown reaction   . Vicodin [Hydrocodone-Acetaminophen]     Made patient fall     No current facility-administered medications on file prior to encounter.    Current Outpatient Prescriptions on File Prior to Encounter  Medication Sig  . acetaminophen (TYLENOL) 500 MG tablet Take 1,000 mg by mouth 2 (two) times daily.   Marland Kitchen albuterol (PROVENTIL HFA;VENTOLIN HFA) 108 (90 Base) MCG/ACT inhaler Inhale 2 puffs into the lungs every 6 (six) hours as needed for wheezing or shortness of breath.  . cephALEXin (KEFLEX) 500 MG capsule Take 500 mg by mouth daily. continuously  . fentaNYL (DURAGESIC - DOSED MCG/HR) 25 MCG/HR patch Place 25 mcg onto the skin every 3 (three) days.  . fluticasone (FLONASE) 50 MCG/ACT nasal spray Place 1 spray into both nostrils daily as needed for allergies.   . furosemide (LASIX) 20 MG tablet Take 20 mg by mouth daily as needed (if weight gain is greater than 3lbs).   . gabapentin (NEURONTIN) 100 MG capsule Take 200 mg by mouth 2 (two) times daily.  Marland Kitchen gentamicin (GARAMYCIN) IVPB Inject 80 mg into the vein See admin instructions. Given daily Started 03/20 for 3 days  . loratadine (CLARITIN) 10 MG tablet Take 10 mg by mouth daily.  . mirabegron ER (MYRBETRIQ) 50 MG TB24 tablet Take 50 mg by mouth daily.  Marland Kitchen  omeprazole (PRILOSEC) 20 MG capsule Take 20 mg by mouth daily.  . ondansetron (ZOFRAN) 4 MG tablet Take 4 mg by mouth every 8 (eight) hours as needed for nausea or vomiting.  . polyethylene glycol (MIRALAX / GLYCOLAX) packet Take by mouth daily.  . potassium chloride (K-DUR,KLOR-CON) 10 MEQ tablet Take 10 mEq by mouth daily.  Marland Kitchen sulfamethoxazole-trimethoprim (BACTRIM DS,SEPTRA DS) 800-160 MG tablet Take 1 tablet by mouth 2 (two) times daily. Started 03/24 for 7 days    FAMILY HISTORY:  Her indicated that  the status of her mother is unknown. She indicated that the status of her father is unknown.    SOCIAL HISTORY: She  reports that she has never smoked. She does not have any smokeless tobacco history on file. She reports that she does not drink alcohol.  REVIEW OF SYSTEMS:   Unobtainable  VITAL SIGNS: BP (!) 155/67   Pulse 77   Resp (!) 21   SpO2 98%   HEMODYNAMICS:    VENTILATOR SETTINGS: Vent Mode: PRVC FiO2 (%):  [60 %] 60 % Set Rate:  [18 bmp] 18 bmp Vt Set:  [310 mL] 310 mL PEEP:  [5 cmH20] 5 cmH20 Plateau Pressure:  [15 cmH20] 15 cmH20  INTAKE / OUTPUT: No intake/output data recorded.  PHYSICAL EXAMINATION: General:  Unresponsive, GCS of 3 Neuro:  See neuro note. Irregular pupils. Minimal withdrawal to painful stimuli on the left side HEENT:  AT/[place] Cardiovascular: S1-S2 positive. Lungs:  Clear to auscultation bilaterally Abdomen:  Benign Musculoskeletal:  Some bruising from fall Skin:  Bruise on the right side lower extremity  LABS:  BMET  Recent Labs Lab 11/30/2016 1057 11/22/2016 1102  NA 141 143  K 3.3* 3.2*  CL 108 106  CO2 24  --   BUN 18 21*  CREATININE 0.88 0.80  GLUCOSE 126* 129*    Electrolytes  Recent Labs Lab 11/11/2016 1057  CALCIUM 8.8*    CBC  Recent Labs Lab 11/20/2016 1057 11/15/2016 1102  WBC 8.0  --   HGB 12.1 12.6  HCT 37.4 37.0  PLT 268  --     Coag's No results for input(s): APTT, INR in the last 168 hours.  Sepsis Markers No results for input(s): LATICACIDVEN, PROCALCITON, O2SATVEN in the last 168 hours.  ABG No results for input(s): PHART, PCO2ART, PO2ART in the last 168 hours.  Liver Enzymes  Recent Labs Lab 11/19/2016 1057  AST 24  ALT 8*  ALKPHOS 75  BILITOT 0.6  ALBUMIN 3.9    Cardiac Enzymes  Recent Labs Lab 11/23/2016 1057  TROPONINI <0.03    Glucose  Recent Labs Lab 11/20/2016 1055  GLUCAP 127*    Imaging Ct Cervical Spine Wo Contrast  Result Date: 11/14/2016 CLINICAL DATA:   Unresponsive.  Right thalamic hemorrhage. EXAM: CT CERVICAL SPINE WITHOUT CONTRAST TECHNIQUE: Multidetector CT imaging of the cervical spine was performed without intravenous contrast. Multiplanar CT image reconstructions were also generated. COMPARISON:  Head CT 12/02/2016 FINDINGS: Alignment: Normal. Skull base and vertebrae: No acute fracture. No primary bone lesion or focal pathologic process. Soft tissues and spinal canal: No prevertebral fluid or swelling. No visible canal hematoma. Disc levels: Multilevel osteoarthritic changes with disc space narrowing, sclerotic remodeling of vertebral bodies. Mild posterior facet arthropathy. Upper chest: Negative. Other: Known intracranial hemorrhage with blood products within the fourth ventricle, and cerebellar peduncle, partially visualized. IMPRESSION: No evidence of traumatic injury to the cervical spine. Multilevel osteoarthritic changes. Known intracranial hemorrhage. Electronically Signed   By: Sharlyne Pacas  Dimitrova M.D.   On: Jun 08, 2017 11:48   Dg Chest Portable 1 View  Result Date: July 16, 2016 CLINICAL DATA:  Status post intubation. EXAM: PORTABLE CHEST 1 VIEW COMPARISON:  09/07/2016 FINDINGS: There has been interval intubation with endotracheal tube terminating in the right mainstem bronchus. Retraction by 4.5 cm is recommended. Cardiomediastinal silhouette is normal. Mediastinal contours appear intact. Calcific atherosclerotic disease of the aorta. There is no evidence of focal airspace consolidation, pleural effusion or pneumothorax. Low lung volumes. Chronic interstitial lung changes. Osseous structures are without acute abnormality. 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 Soft tissues are grossly normal. IMPRESSION: Interval right mainstem bronchus intubation. Retraction of the endotracheal tube by 4.5 cm is recommended. No evidence of pneumothorax. Low lung volumes. Chronic interstitial lung changes. These results were called by  telephone at the time of interpretation on July 16, 2016 at 11:23 am to Dr. Raeford RazorSTEPHEN KOHUT , who verbally acknowledged these results. Electronically Signed   By: Ted Mcalpineobrinka  Dimitrova M.D.   On: Jun 08, 2017 11:22   Ct Head Code Stroke Wo Contrast  Result Date: July 16, 2016 CLINICAL DATA:  Code stroke. Left-sided paralysis. Unequal pupils. Unresponsive. EXAM: CT HEAD WITHOUT CONTRAST TECHNIQUE: Contiguous axial images were obtained from the base of the skull through the vertex without intravenous contrast. COMPARISON:  11/11/2015 FINDINGS: Brain: Acute right thalamic intraparenchymal hematoma measuring 4.5 x 3.2 x 3.3 cm (volume = 25 cm^3) with extension into the right cerebral peduncle and mid brain and with intraventricular penetration. Moderate amount of blood in the lateral ventricles. Filling of the third ventricle. Large amount of blood in the fourth ventricle. Possible acute ventricular enlargement. Right-to-left midline shift of 4 mm. Background pattern elsewhere brain atrophy and chronic small vessel disease. Vascular: There is atherosclerotic calcification of the major vessels at the base of the brain. Skull: Normal Sinuses/Orbits: Clear/ normal Other: None significant IMPRESSION: 1. Acute right thalamic intraparenchymal hemorrhage with hematoma measuring 4.5 x 3.2 x 3.3 cm, showing extension into the right cerebral peduncle and midbrain and intraventricular penetration, probably with ventricular dilatation. 2. Critical Value/emergent results were called by telephone at the time of interpretation on July 16, 2016 at 11:35 am to Dr. Benay SpiceAshraghi, who verbally acknowledged these results. Electronically Signed   By: Paulina FusiMark  Shogry M.D.   On: Jun 08, 2017 11:38     STUDIES:  Chest x-ray showing right mainstem intubation. CT head showing acute right thalamic intraparenchymal hemorrhage with hematoma measuring 4.5 x 3.2 x 3.3 cm. CT cervical spine negative for any findings related to  trauma  CULTURES: None  ANTIBIOTICS: None  SIGNIFICANT EVENTS: See history of present illness  LINES/TUBES: Intubated June 30  DISCUSSION:  83 year old female with intracranial hemorrhage. Family does not want any aggressive intervention or escalation of care. They would like to withdraw care once family members arrive from out of town which would be likely tomorrow.   ASSESSMENT / PLAN:  NEUROLOGIC A:   ICH P:   Had prolonged discussion with the family. Family has elected DO NOT RESUSCITATE. Have discussed in detail with patient's 3 children who said that she has spoken multiple times in last few months that she wants to die. Also after her hip surgery she had said she would not have any more surgeries.  Neurology consultant at bedside who also recommends no surgical intervention.  We will manage essentially blood pressure control as recommended by neurology. She is on clevidipine drip  PULMONARY A: Ventilator-dependent respiratory failure secondary to intracranial hemorrhage and inability to protect airway P:   Family has  elected withdrawal of care. We will continue ventilator support until certain family members arrive from [facility] and [employer] by tomorrow  RENAL A:   No acute issues  GASTROINTESTINAL GI prophylaxis   HEMATOLOGIC No pharmacological DVT prophylaxis due to bleed  Will use SCDs  INFECTIOUS No active signs of infection   ENDOCRINE Insulin sliding scale  FAMILY  - Updates:  Family has elected patient to be DO NOT RESUSCITATE. They would like withdrawal of care once remaining family arrives from outside of town.  STAFF NOTE: I, Wilber Oliphant, MD have personally reviewed patient's available data, including medical history, events of note, physical examination and test results as part of my evaluation. The patient is critically ill with multiple organ systems failure and requires high complexity decision making for assessment and support,  frequent evaluation and titration of therapies, application of advanced monitoring technologies and extensive interpretation of multiple databases.   Critical Care Time devoted to patient care services described in this note is 55  Minutes. This time reflects time of care of this signee: Wilber Oliphant, MD.   Pulmonary and Critical Care Medicine [facility] Pager: [phone]  12/07/2016, 12:23 PM

## 2016-12-10 NOTE — Progress Notes (Signed)
CDS notified of impending death.  Referral number [id number].  Please call with cardiac TOD.

## 2016-12-10 NOTE — Progress Notes (Signed)
PT Cancellation Note  Patient Details Name: Monica Ramirez MRN: [id number] DOB: [birth year]   Cancelled Treatment:    Reason Eval/Treat Not Completed: Patient not medically ready (active bedrest orders at this time)   Fabio AsaDevon J Eleah Ramirez 12/08/2016, 3:13 PM Monica Crumbevon Ladonte Ramirez, PT DPT [facility] [phone]

## 2016-12-10 NOTE — ED Provider Notes (Signed)
[facility]-EMERGENCY DEPT Provider Note   CSN: [id number] Arrival date & time: 12-18-2016  1053  By signing my name below, I, Monica Ramirez, attest that this documentation has been prepared under the direction and in the presence of Monica Razor, MD. Electronically Signed: Sonum Ramirez, Neurosurgeon. 12/18/16. 11:13 AM.  History   Chief Complaint No chief complaint on file.   The history is provided by the EMS personnel. The history is limited by the condition of the patient. No language interpreter was used.    LEVEL 5 CAVEAT: Patient unable to provide history  HPI Comments: Monica Ramirez is a 83 y.o. female brought in by ambulance, who presents to the Emergency Department today status post fall and possible stroke. Patient comes from home where it was reported she had a fall. Upon EMS arrival patient was not moving her left side and seemed confused. She was awake, responding, and touching her head. She became increasingly less responsive en route.    Past Medical History:  Diagnosis Date  . Arthritis     There are no active problems to display for this patient.   Past Surgical History:  Procedure Laterality Date  . ABDOMINAL HYSTERECTOMY    . EYE SURGERY     cataract  . TOTAL HIP ARTHROPLASTY      OB History    No data available       Home Medications    Prior to Admission medications   Medication Sig Start Date End Date Taking? Authorizing Provider  acetaminophen (TYLENOL) 500 MG tablet Take 1,000 mg by mouth 2 (two) times daily.     [provider]  albuterol (PROVENTIL HFA;VENTOLIN HFA) 108 (90 Base) MCG/ACT inhaler Inhale 2 puffs into the lungs every 6 (six) hours as needed for wheezing or shortness of breath.    [provider]  cephALEXin (KEFLEX) 500 MG capsule Take 500 mg by mouth daily. continuously    [provider]  fentaNYL (DURAGESIC - DOSED MCG/HR) 25 MCG/HR patch Place 25 mcg onto the skin every 3 (three) days.    [provider]  fluticasone (FLONASE) 50 MCG/ACT nasal spray Place 1 spray into both nostrils daily as needed for allergies.     [provider]  furosemide (LASIX) 20 MG tablet Take 20 mg by mouth daily as needed (if weight gain is greater than 3lbs).     [provider]  gabapentin (NEURONTIN) 100 MG capsule Take 200 mg by mouth 2 (two) times daily.    [provider]  gentamicin (GARAMYCIN) IVPB Inject 80 mg into the vein See admin instructions. Given daily Started 03/20 for 3 days    [provider]  loratadine (CLARITIN) 10 MG tablet Take 10 mg by mouth daily.    [provider]  mirabegron ER (MYRBETRIQ) 50 MG TB24 tablet Take 50 mg by mouth daily.    [provider]  omeprazole (PRILOSEC) 20 MG capsule Take 20 mg by mouth daily.    [provider]  ondansetron (ZOFRAN) 4 MG tablet Take 4 mg by mouth every 8 (eight) hours as needed for nausea or vomiting.    [provider]  polyethylene glycol (MIRALAX / GLYCOLAX) packet Take by mouth daily.    [provider]  potassium chloride (K-DUR,KLOR-CON) 10 MEQ tablet Take 10 mEq by mouth daily.    [provider]  sulfamethoxazole-trimethoprim (BACTRIM DS,SEPTRA DS) 800-160 MG tablet Take 1 tablet by mouth 2 (two) times daily. Started 03/24 for 7  days    [provider]    Family History Family History  Problem Relation Age of Onset  . Cancer Mother   . Heart attack Father     Social History Social History  Substance Use Topics  . Smoking status: Never Smoker  . Smokeless tobacco: Not on file  . Alcohol use No     Allergies   Cefdinir; Inderal [propranolol]; Levaquin [levofloxacin]; Penicillins; Statins; and Vicodin [hydrocodone-acetaminophen]   Review of Systems Review of Systems  Unable to perform ROS: Patient nonverbal     Physical Exam Updated Vital Signs There were no vitals taken for this visit.  Physical Exam    Constitutional: Cervical collar in place.  Cervical collar in place.   HENT:  Head: Normocephalic.  Right Ear: External ear normal.  Left Ear: External ear normal.  Eyes: Right pupil is not reactive. Left pupil is not reactive. Pupils are unequal.  Pupils are irregular and non-reactive. Right is 5mm, left is 2mm.   Neck: No tracheal deviation present.  Cardiovascular: Normal rate, regular rhythm and normal heart sounds.   No murmur heard. Pulmonary/Chest: Effort normal and breath sounds normal. No respiratory distress. She has no wheezes. She has no rales.  No gag but breath sounds are clear bilaterally   Musculoskeletal:  No external signs of trauma  Neurological: She is unresponsive. She exhibits abnormal muscle tone.  Unresponsive to voice, not following commands. Withdraws right upper and right lower extremities to pain. No movement on the left side.  Possible left facial droop.   Nursing note and vitals reviewed.   ED Treatments / Results  Labs (all labs ordered are listed, but only abnormal results are displayed) Labs Reviewed  CBG MONITORING, ED - Abnormal; Notable for the following:       Result Value   Glucose-Capillary 127 (*)    All other components within normal limits  I-STAT CHEM 8, ED - Abnormal; Notable for the following:    Potassium 3.2 (*)    BUN 21 (*)    Glucose, Bld 129 (*)    Calcium, Ion 1.12 (*)    All other components within normal limits    EKG  EKG Interpretation None       Radiology Dg Chest Portable 1 View  Result Date: 03/27/2017 CLINICAL DATA:  Status post intubation. EXAM: PORTABLE CHEST 1 VIEW COMPARISON:  09/07/2016 FINDINGS: There has been interval intubation with endotracheal tube terminating in the right mainstem bronchus. Retraction by 4.5 cm is recommended. Cardiomediastinal silhouette is normal. Mediastinal contours appear intact. Calcific atherosclerotic disease of the aorta. There is no evidence of focal airspace  consolidation, pleural effusion or pneumothorax. Low lung volumes. Chronic interstitial lung changes. Osseous structures are without acute abnormality. 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 Soft tissues are grossly normal. IMPRESSION: Interval right mainstem bronchus intubation. Retraction of the endotracheal tube by 4.5 cm is recommended. No evidence of pneumothorax. Low lung volumes. Chronic interstitial lung changes. These results were called by telephone at the time of interpretation on 03/27/2017 at 11:23 am to Dr. Raeford RazorSTEPHEN Mkenzie Dotts , who verbally acknowledged these results. Electronically Signed   By: Ted Mcalpineobrinka  Dimitrova M.D.   On: 010/15/2018 11:22   Ct Head Code Stroke Wo Contrast  Result Date: 03/27/2017 CLINICAL DATA:  Code stroke. Left-sided paralysis. Unequal pupils. Unresponsive. EXAM: CT HEAD WITHOUT CONTRAST TECHNIQUE: Contiguous axial images were obtained from the base of the skull through the vertex without intravenous contrast. COMPARISON:  11/11/2015 FINDINGS: Brain:  Acute right thalamic intraparenchymal hematoma measuring 4.5 x 3.2 x 3.3 cm (volume = 25 cm^3) with extension into the right cerebral peduncle and mid brain and with intraventricular penetration. Moderate amount of blood in the lateral ventricles. Filling of the third ventricle. Large amount of blood in the fourth ventricle. Possible acute ventricular enlargement. Right-to-left midline shift of 4 mm. Background pattern elsewhere brain atrophy and chronic small vessel disease. Vascular: There is atherosclerotic calcification of the major vessels at the base of the brain. Skull: Normal Sinuses/Orbits: Clear/ normal Other: None significant IMPRESSION: 1. Acute right thalamic intraparenchymal hemorrhage with hematoma measuring 4.5 x 3.2 x 3.3 cm, showing extension into the right cerebral peduncle and midbrain and intraventricular penetration, probably with ventricular dilatation. 2. Critical Value/emergent results were  called by telephone at the time of interpretation on 12/26/16 at 11:35 am to Dr. Benay Spice, who verbally acknowledged these results. Electronically Signed   By: Paulina Fusi M.D.   On: 26-Dec-2016 11:38    Procedures Procedures (including critical care time)  CRITICAL CARE Performed by: Monica Razor, MD Total critical care time: 50 minutes Critical care time was exclusive of separately billable procedures and treating other patients. Critical care was necessary to treat or prevent imminent or life-threatening deterioration. Critical care was time spent personally by me on the following activities: development of treatment plan with patient and/or surrogate as well as nursing, discussions with consultants, evaluation of patient's response to treatment, examination of patient, obtaining history from patient or surrogate, ordering and performing treatments and interventions, ordering and review of laboratory studies, ordering and review of radiographic studies, pulse oximetry and re-evaluation of patient's condition.   INTUBATION Performed by: Monica Razor, MD   Required items: required blood products, implants, devices, and special equipment available Patient identity confirmed: provided demographic data and hospital-assigned identification number Time out: Immediately prior to procedure a "time out" was called to verify the correct patient, procedure, equipment, support staff and site/side marked as required.  Indications: airway protection Intubation method: Glidescope Laryngoscopy   Preoxygenation: BVM  Sedatives: Etomidate Paralytic: Succinylcholine  Tube Size: 7.5 cuffed  Post-procedure assessment: chest rise and ETCO2 monitor Breath sounds: equal and absent over the epigastrium Tube secured with: ETT holder Chest x-ray interpreted by radiologist and me.  Chest x-ray findings: endotracheal tube r mainstem and retracted.  Patient tolerated the procedure well with no  immediate complications.     Medications Ordered in ED Medications  etomidate (AMIDATE) injection (20 mg Intravenous Given 12-26-2016 1102)  succinylcholine (ANECTINE) injection (80 mg Intravenous Given 26-Dec-2016 1102)     Initial Impression / Assessment and Plan / ED Course  I have reviewed the triage vital signs and the nursing notes.  Pertinent labs & imaging results that were available during my care of the patient were reviewed by me and considered in my medical decision making (see chart for details).  Clinical Course as of Dec 11 1119  Sat 26-Dec-2016  1118 ETT R mainstem. Pulled back 3 cm prior to her going to CT.   [SK]    Clinical Course User Index [SK] Monica Razor, MD    Unresponsive. Intubated for airway protection. Large head bleed. Intervention unrealistic per neurosurgery. Admit to ccm.   Final Clinical Impressions(s) / ED Diagnoses   Final diagnoses:  Unresponsive  Intracranial hemorrhage (HCC)    New Prescriptions New Prescriptions   No medications on file   I personally preformed the services scribed in my presence. The recorded information has been  reviewed is accurate. Monica Razor, MD.     Monica Razor, MD 12/18/16 [phone]

## 2016-12-10 NOTE — Progress Notes (Signed)
Spoke w/ Dr Marylouise StacksPanchal re: ABG and current vent settings.  Per MD, reduce VT to 600, and increase set resp rate to 20= done.  RN aware.

## 2016-12-10 NOTE — Progress Notes (Signed)
Pt just received from ED RT.  Per ED RT, ABG done on current vent settings and MD placed vent orders while RT was running ABG.  Waiting on MD for vent order clarifications.

## 2016-12-11 ENCOUNTER — Encounter (HOSPITAL_COMMUNITY): Payer: Self-pay

## 2016-12-11 DIAGNOSIS — I615 Nontraumatic intracerebral hemorrhage, intraventricular: Principal | ICD-10-CM

## 2016-12-11 DIAGNOSIS — G919 Hydrocephalus, unspecified: Secondary | ICD-10-CM

## 2016-12-11 DIAGNOSIS — I161 Hypertensive emergency: Secondary | ICD-10-CM

## 2016-12-11 MED ORDER — POLYVINYL ALCOHOL 1.4 % OP SOLN: 1.0000 [drp] | Freq: Four times a day (QID) | OPHTHALMIC | Status: DC | PRN

## 2016-12-11 MED ORDER — HALOPERIDOL LACTATE 5 MG/ML IJ SOLN: 0.5000 mg | INTRAMUSCULAR | Status: DC | PRN

## 2016-12-11 MED ORDER — HALOPERIDOL 1 MG PO TABS
0.5000 mg | ORAL_TABLET | ORAL | Status: DC | PRN
  Filled 2016-12-11: qty 1

## 2016-12-11 MED ORDER — SCOPOLAMINE 1 MG/3DAYS TD PT72
1.0000 | MEDICATED_PATCH | TRANSDERMAL | Status: DC
  Administered 2016-12-11: 1.5 mg via TRANSDERMAL
  Filled 2016-12-11: qty 1

## 2016-12-11 MED ORDER — HALOPERIDOL LACTATE 2 MG/ML PO CONC
0.5000 mg | ORAL | Status: DC | PRN
  Filled 2016-12-11: qty 0.3

## 2016-12-11 MED ORDER — GLYCOPYRROLATE 0.2 MG/ML IJ SOLN: 0.2000 mg | INTRAMUSCULAR | Status: DC | PRN

## 2016-12-11 MED ORDER — BIOTENE DRY MOUTH MT LIQD: 15.0000 mL | OROMUCOSAL | Status: DC | PRN

## 2016-12-11 MED ORDER — GLYCOPYRROLATE 1 MG PO TABS
1.0000 mg | ORAL_TABLET | ORAL | Status: DC | PRN
  Filled 2016-12-11: qty 1

## 2016-12-11 NOTE — Progress Notes (Addendum)
STROKE TEAM PROGRESS NOTE   HISTORY OF PRESENT ILLNESS (per record) Monica Ramirez is a 83 y.o. female who presents unconscious with left sided paralysis. She had a witnessed fall and become unconscious.  BP 240/120's on arrival.  CT Brain shows large right thalamic/midbrain ICH with right to left shift and intraventricular extension and early hydrocephalus.     SUBJECTIVE (INTERVAL HISTORY) Her RN is at the bedside.  Patient has been put on full comfort care with morphine drip.  OBJECTIVE Temp:  [95.6 F (35.3 C)-97.4 F (36.3 C)] 97.4 F (36.3 C) (06/30 2000) Pulse Rate:  [37-87] 87 (07/01 0730) Cardiac Rhythm: Normal sinus rhythm (06/30 2000) Resp:  [12-28] 13 (07/01 0730) BP: (78-229)/(43-207) 158/74 (06/30 2300) SpO2:  [85 %-100 %] 85 % (07/01 0730) FiO2 (%):  [50 %-60 %] 50 % (06/30 1926) Weight:  [60.4 kg (133 lb 2.5 oz)] 60.4 kg (133 lb 2.5 oz) (06/30 1250)  CBC:   Recent Labs Lab December 22, 2016 1057 December 22, 2016 1102  WBC 8.0  --   NEUTROABS 3.6  --   HGB 12.1 12.6  HCT 37.4 37.0  MCV 96.4  --   PLT 268  --     Basic Metabolic Panel:   Recent Labs Lab 12/22/2016 1057 12/22/16 1102  NA 141 143  K 3.3* 3.2*  CL 108 106  CO2 24  --   GLUCOSE 126* 129*  BUN 18 21*  CREATININE 0.88 0.80  CALCIUM 8.8*  --     Lipid Panel: No results found for: CHOL, TRIG, HDL, CHOLHDL, VLDL, LDLCALC HgbA1c: No results found for: HGBA1C Urine Drug Screen: No results found for: LABOPIA, COCAINSCRNUR, LABBENZ, AMPHETMU, THCU, LABBARB  Alcohol Level No results found for: ETH  IMAGING I have personally reviewed the radiological images below and agree with the radiology interpretations.  Ct Cervical Spine Wo Contrast 22-Dec-2016 No evidence of traumatic injury to the cervical spine. Multilevel osteoarthritic changes. Known intracranial hemorrhage.   Ct Head Code Stroke Wo Contrast December 22, 2016 Acute right thalamic intraparenchymal hemorrhage with hematoma measuring 4.5 x 3.2 x 3.3  cm, showing extension into the right cerebral peduncle and midbrain and intraventricular penetration, probably with ventricular dilatation.     PHYSICAL EXAM  Temp:  [95.6 F (35.3 C)-98.7 F (37.1 C)] 98.7 F (37.1 C) (07/01 0847) Pulse Rate:  [47-96] 94 (07/01 1145) Resp:  [8-28] 10 (07/01 1145) BP: (87-176)/(49-96) 158/74 (06/30 2300) SpO2:  [75 %-100 %] 76 % (07/01 1145) FiO2 (%):  [50 %] 50 % (06/30 1926) Weight:  [133 lb 2.5 oz (60.4 kg)] 133 lb 2.5 oz (60.4 kg) (06/30 1250)  General - Well nourished, well developed, in no apparent distress.  Ophthalmologic - Fundi not visualized.  Cardiovascular - Regular rate and rhythm.  Neuro - Limited exam due to comfort care measures. Eyes close, not open eyes on voice stimulation. PERRL, sluggish to light, ice middle position, not blinking to visual threat bilaterally. Seems to have right facial droop, may related to positioning however. No spontaneous movement in all extremities.   ASSESSMENT/PLAN [name] is a 83 y.o. female with history of arthritis presenting with a witnessed fall, unconsciousness, and subsequent left-sided paralysis.  She did not receive IV t-PA due to ICH.  ICH:  Large right thalamus ICH with IVH likely secondary to uncontrolled hypertension.  Resultant  Unresponsive under comfort care measures  CT head - Acute right thalamic intraparenchymal hemorrhage with hematoma measuring 4.5 x 3.2 x 3.3 cm.  Discuss with  family regarding full prognosis, family requested comfort care measures Diet NPO time specified  Disposition:  On comfort care, transfer to palliative care unit  Hydrocephalus  [facility]  Comfort care  Hypertensive emergency  Likely the cause of current ICH  Was on Cleviprex, Now off  Other Stroke Risk Factors  Advanced age  Arthritis  Hospital day # 1  Marvel PlanJindong Terrick Allred, MD PhD Stroke Neurology 12/11/2016 12:39 PM   To contact Stroke Continuity provider,  please refer to [email or web address]. After hours, contact General Neurology

## 2016-12-11 NOTE — Progress Notes (Signed)
Comfort care has been initiated.  The patient's family was updated on care specifics.  Morphine is infusing at 10mg /hr. RT removed mechanical ventilation at 0000.    Nursing assessment of the patient shows that she is comfortable.

## 2016-12-11 NOTE — Progress Notes (Signed)
PULMONARY / CRITICAL CARE MEDICINE   Name: Monica Ramirez MRN: [id number] DOB: [birth year]    ADMISSION DATE:  20-Sep-2016  REFERRING MD:  Dr Amparo Bristolancouver  CHIEF COMPLAINT:  Left sided weakness, slurring of speech  HISTORY OF PRESENT ILLNESS:    This is a 83 year old female who presented with "stroke. History obtained from patient's son. Patient today morning around 10:30 went to the bathroom and then called for help. When the patient's son went to the bathroom he found her laying on the floor. She had sustained a fall but was awake and was having slurring of speech. She was pointing to the right side of her head showing where she bumped her head. She was also having left-sided weakness. They called EMS and patient was brought to ER and intubated for airway protection.  She was also noted to have irregular pupils in the ER. She had a CT head done which showed right thalamic midbrain intracranial hemorrhage with right to left shift and intraventricular extension.   History entirely obtained from ER physician and patient's family members.  Patient was functional up to yesterday where she was lucid and could move around herself without help.   VITAL SIGNS: BP (!) 155/67   Pulse 77   Resp (!) 21   SpO2 98%   HEMODYNAMICS:    VENTILATOR SETTINGS:   INTAKE / OUTPUT: No intake/output data recorded.  PHYSICAL EXAMINATION: Extubated on mso4 drip with agonal resp  LABS:  BMET  Recent Labs Lab 11-12-16 1057 11-12-16 1102  NA 141 143  K 3.3* 3.2*  CL 108 106  CO2 24  --   BUN 18 21*  CREATININE 0.88 0.80  GLUCOSE 126* 129*    Electrolytes  Recent Labs Lab 11-12-16 1057  CALCIUM 8.8*    CBC  Recent Labs Lab 11-12-16 1057 11-12-16 1102  WBC 8.0  --   HGB 12.1 12.6  HCT 37.4 37.0  PLT 268  --     Coag's No results for input(s): APTT, INR in the last 168 hours.  Sepsis Markers No results for input(s): LATICACIDVEN, PROCALCITON, O2SATVEN in the last  168 hours.  ABG No results for input(s): PHART, PCO2ART, PO2ART in the last 168 hours.  Liver Enzymes  Recent Labs Lab 11-12-16 1057  AST 24  ALT 8*  ALKPHOS 75  BILITOT 0.6  ALBUMIN 3.9    Cardiac Enzymes  Recent Labs Lab 11-12-16 1057  TROPONINI <0.03    Glucose  Recent Labs Lab 11-12-16 1055  GLUCAP 127*    Imaging Ct Cervical Spine Wo Contrast  Result Date: 20-Sep-2016 CLINICAL DATA:  Unresponsive.  Right thalamic hemorrhage. EXAM: CT CERVICAL SPINE WITHOUT CONTRAST TECHNIQUE: Multidetector CT imaging of the cervical spine was performed without intravenous contrast. Multiplanar CT image reconstructions were also generated. COMPARISON:  Head CT 010-Apr-2018 FINDINGS: Alignment: Normal. Skull base and vertebrae: No acute fracture. No primary bone lesion or focal pathologic process. Soft tissues and spinal canal: No prevertebral fluid or swelling. No visible canal hematoma. Disc levels: Multilevel osteoarthritic changes with disc space narrowing, sclerotic remodeling of vertebral bodies. Mild posterior facet arthropathy. Upper chest: Negative. Other: Known intracranial hemorrhage with blood products within the fourth ventricle, and cerebellar peduncle, partially visualized. IMPRESSION: No evidence of traumatic injury to the cervical spine. Multilevel osteoarthritic changes. Known intracranial hemorrhage. Electronically Signed   By: Ted Mcalpineobrinka  Dimitrova M.D.   On: 010-Apr-2018 11:48   Dg Chest Portable 1 View  Result Date: 20-Sep-2016 CLINICAL DATA:  Status post intubation.  EXAM: PORTABLE CHEST 1 VIEW COMPARISON:  09/07/2016 FINDINGS: There has been interval intubation with endotracheal tube terminating in the right mainstem bronchus. Retraction by 4.5 cm is recommended. Cardiomediastinal silhouette is normal. Mediastinal contours appear intact. Calcific atherosclerotic disease of the aorta. There is no evidence of focal airspace consolidation, pleural effusion or pneumothorax. Low  lung volumes. Chronic interstitial lung changes. Osseous structures are without acute abnormality. 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 Soft tissues are grossly normal. IMPRESSION: Interval right mainstem bronchus intubation. Retraction of the endotracheal tube by 4.5 cm is recommended. No evidence of pneumothorax. Low lung volumes. Chronic interstitial lung changes. These results were called by telephone at the time of interpretation on 11/24/2016 at 11:23 am to Dr. [name] , who verbally acknowledged these results. Electronically Signed   By: Ted Mcalpine M.D.   On: 12/05/2016 11:22   Ct Head Code Stroke Wo Contrast  Result Date: 11/28/2016 CLINICAL DATA:  Code stroke. Left-sided paralysis. Unequal pupils. Unresponsive. EXAM: CT HEAD WITHOUT CONTRAST TECHNIQUE: Contiguous axial images were obtained from the base of the skull through the vertex without intravenous contrast. COMPARISON:  11/11/2015 FINDINGS: Brain: Acute right thalamic intraparenchymal hematoma measuring 4.5 x 3.2 x 3.3 cm (volume = 25 cm^3) with extension into the right cerebral peduncle and mid brain and with intraventricular penetration. Moderate amount of blood in the lateral ventricles. Filling of the third ventricle. Large amount of blood in the fourth ventricle. Possible acute ventricular enlargement. Right-to-left midline shift of 4 mm. Background pattern elsewhere brain atrophy and chronic small vessel disease. Vascular: There is atherosclerotic calcification of the major vessels at the base of the brain. Skull: Normal Sinuses/Orbits: Clear/ normal Other: None significant IMPRESSION: 1. Acute right thalamic intraparenchymal hemorrhage with hematoma measuring 4.5 x 3.2 x 3.3 cm, showing extension into the right cerebral peduncle and midbrain and intraventricular penetration, probably with ventricular dilatation. 2. Critical Value/emergent results were called by telephone at the time of interpretation on  11/30/2016 at 11:35 am to Dr. Benay Spice, who verbally acknowledged these results. Electronically Signed   By: Paulina Fusi M.D.   On: 11/21/2016 11:38     STUDIES:   CULTURES: None  ANTIBIOTICS: None  SIGNIFICANT EVENTS: 7/1 one way extubation  LINES/TUBES: Intubated June 30  DISCUSSION:  83 year old female with intracranial hemorrhage. Family does not want any aggressive intervention or escalation of care. They would like to withdraw care once family members arrive from out of town which would be likely tomorrow.   ASSESSMENT / PLAN:  NEUROLOGIC A:   ICH P:   Had prolonged discussion with the family. Family has elected DO NOT RESUSCITATE. Have discussed in detail with patient's 3 children who said that she has spoken multiple times in last few months that she wants to die. Also after her hip surgery she had said she would not have any more surgeries.  EOL care   PULMONARY A: One way extubation 7/1 P:   EOL care RENAL A:   EOL care  GASTROINTESTINAL EOL care  HEMATOLOGIC EOL care  INFECTIOUS EOL care  ENDOCRINE EOL care  FAMILY  - Updates:  Family has elected patient to be DO NOT RESUSCITATE.  Currently of MSO4 drip therefore PCCM will sign off  [facility] ACNP Adolph Pollack PCCM Pager [phone] till 3 pm If no answer page [phone] 12/11/2016, 8:54 AM   STAFF NOTE: I, Rory Percy, MD FACP have personally reviewed patient's available data, including medical history, events of note, physical examination  and test results as part of my evaluation. I have discussed with resident/NP and other care providers such as pharmacist, RN and RRT. In addition, I personally evaluated patient and elicited key findings of: unresponsive, no distress, coarse BS, pupils non reactive, CT I reviewed with massive bleed, for comfort care, morphine titration to rr 12-18, no role oxygen will prolong suffering, any abnormal movements would need ativan, will sign off  Mcarthur Rossetti.  Tyson Alias, MD, FACP Pgr: [phone] [facility] 12/11/2016 10:45 AM

## 2016-12-11 DEATH — deceased

## 2016-12-12 DIAGNOSIS — I629 Nontraumatic intracranial hemorrhage, unspecified: Secondary | ICD-10-CM

## 2017-01-11 NOTE — Progress Notes (Signed)
Called by family to room. Patient stop breathing. Noted patient stop breathing, no HR, no BP. Another RN verified assessment. Md made aware.

## 2017-01-11 NOTE — Progress Notes (Signed)
[place] [facility] referral done with referral number [id number]. Patient is possible tissue donation per Jolaine ArtistKaren Lavu

## 2017-01-11 NOTE — Progress Notes (Signed)
Body brought to the morgue by staff.

## 2017-01-11 NOTE — Clinical Social Work Note (Addendum)
CSW acknowledges consult for "Comfort Care." Family has been at pt bedside. Pt and family [facility] cha[facility] and family minister. Disposition plan to be decided. CSW available and following for support and disposition coordination.  Corlis HoveJeneya Shuaib Corsino, LCSWA, LCASA Clinical Social Work [phone]

## 2017-01-11 NOTE — Progress Notes (Signed)
200 mL of Morphine wasted in sink with Christel MormonZee, RCharity fundraiser

## 2017-01-11 NOTE — Death Summary Note (Signed)
Stroke Discharge Summary  Patient ID: Monica Ramirez    l   MRN: [id number]      DOB: 1920/08/13  Date of Admission: 2017/05/27 Date of Discharge: 01/04/2017  Attending Physician:  Marvel PlanXu, Bernita Beckstrom, MD, Stroke MD Consultant(s):    pulmonary/intensive care Neurosurgery Patient's PCP:  Brooke BonitoGallemore, Warren, MD  DISCHARGE DIAGNOSIS:  Active Problems:   Right thalamic large ICH (HCC)   IVH (intraventricular hemorrhage) (HCC)   Hydrocephalus   Cerebral edema   Hypertensive emergency   Past Medical History:  Diagnosis Date  . Arthritis    Past Surgical History:  Procedure Laterality Date  . ABDOMINAL HYSTERECTOMY    . EYE SURGERY     cataract  . TOTAL HIP ARTHROPLASTY      LABORATORY STUDIES CBC    Component Value Date/Time   WBC 8.0 02018/12/15 1057   RBC 3.88 02018/12/15 1057   HGB 12.6 02018/12/15 1102   HCT 37.0 02018/12/15 1102   PLT 268 02018/12/15 1057   MCV 96.4 02018/12/15 1057   MCH 31.2 02018/12/15 1057   MCHC 32.4 02018/12/15 1057   RDW 13.6 02018/12/15 1057   LYMPHSABS 3.7 02018/12/15 1057   MONOABS 0.4 02018/12/15 1057   EOSABS 0.3 02018/12/15 1057   BASOSABS 0.1 02018/12/15 1057   CMP    Component Value Date/Time   NA 143 02018/12/15 1102   K 3.2 (L) 02018/12/15 1102   CL 106 02018/12/15 1102   CO2 24 02018/12/15 1057   GLUCOSE 129 (H) 02018/12/15 1102   BUN 21 (H) 02018/12/15 1102   CREATININE 0.80 02018/12/15 1102   CALCIUM 8.8 (L) 02018/12/15 1057   PROT 7.1 02018/12/15 1057   ALBUMIN 3.9 02018/12/15 1057   AST 24 02018/12/15 1057   ALT 8 (L) 02018/12/15 1057   ALKPHOS 75 02018/12/15 1057   BILITOT 0.6 02018/12/15 1057   GFRNONAA 55 (L) 02018/12/15 1057   GFRAA >60 02018/12/15 1057   COAGSNo results found for: INR, PROTIME Lipid PanelNo results found for: CHOL, TRIG, HDL, CHOLHDL, VLDL, LDLCALC HgbA1C No results found for: HGBA1C Urinalysis    Component Value Date/Time   COLORURINE STRAW (A) 09/07/2016 1445   APPEARANCEUR CLEAR 09/07/2016 1445   LABSPEC 1.009  09/07/2016 1445   PHURINE 7.0 09/07/2016 1445   GLUCOSEU NEGATIVE 09/07/2016 1445   HGBUR SMALL (A) 09/07/2016 1445   BILIRUBINUR NEGATIVE 09/07/2016 1445   KETONESUR NEGATIVE 09/07/2016 1445   PROTEINUR NEGATIVE 09/07/2016 1445   NITRITE NEGATIVE 09/07/2016 1445   LEUKOCYTESUR NEGATIVE 09/07/2016 1445   Urine Drug Screen No results found for: LABOPIA, COCAINSCRNUR, LABBENZ, AMPHETMU, THCU, LABBARB  Alcohol Level No results found for: [facility]   SIGNIFICANT DIAGNOSTIC STUDIES I have personally reviewed the radiological images below and agree with the radiology interpretations.  Ct Cervical Spine Wo Contrast 2017/05/27 No evidence of traumatic injury to the cervical spine. Multilevel osteoarthritic changes. Known intracranial hemorrhage.   Ct Head Code Stroke Wo Contrast 2017/05/27 Acute right thalamic intraparenchymal hemorrhage with hematoma measuring 4.5 x 3.2 x 3.3 cm, showing extension into the right cerebral peduncle and midbrain and intraventricular penetration, probably with ventricular dilatation.    HISTORY OF PRESENT ILLNESS Elderly woman who was witnessed to fall and become unconscious.  BP 240/120's on arrival. CT Brain shows large right thalamic/midbrain ICH with right to left shift and intraventricular extension and early hydrocephalus.     HOSPITAL COURSE [name] is a 83 y.o. female with history of arthritis presenting with a witnessed fall, unconsciousness, and subsequent  left-sided paralysis.  She did not receive IV t-PA due to ICH.  ICH:  Large right thalamus ICH with IVH likely secondary to uncontrolled hypertension.  Resultant  Unresponsive under comfort care measures  CT head - Acute right thalamic intraparenchymal hemorrhage with hematoma measuring 4.5 x 3.2 x 3.3 cm.  Discuss with family regarding full prognosis, family requested comfort care measures  Diet NPO time specified  Disposition:  On comfort care  Hydrocephalus  Obstructive  due to IVH  Comfort care  Hypertensive emergency  Likely the cause of current ICH  Was on Cleviprex, Now off  Other Stroke Risk Factors  Advanced age  Arthritis  DISCHARGE EXAM Pt deceased  20 minutes were spent preparing discharge.  Marvel Plan, MD PhD Stroke Neurology 12/11/2016 12:13 PM

## 2017-01-11 NOTE — Progress Notes (Signed)
   02-14-2017 0950  Clinical Encounter Type  Visited With Patient and family together  Visit Type Other (Comment) ([place] consult)  Spiritual Encounters  Spiritual Needs Emotional  Stress Factors  Patient Stress Factors Major life changes  Family Stress Factors Family relationships  Introduction to family as Pt sleeping. Family coping and reported no immediate needs.

## 2017-01-11 DEATH — deceased

## 2017-12-26 IMAGING — DX DG CHEST 1V PORT
1 series · 1 of 1 positions shown · non-contrast
Comparison: 09/07/2016

CLINICAL DATA: Status post intubation.

EXAM:
PORTABLE CHEST 1 VIEW

[chest ap]
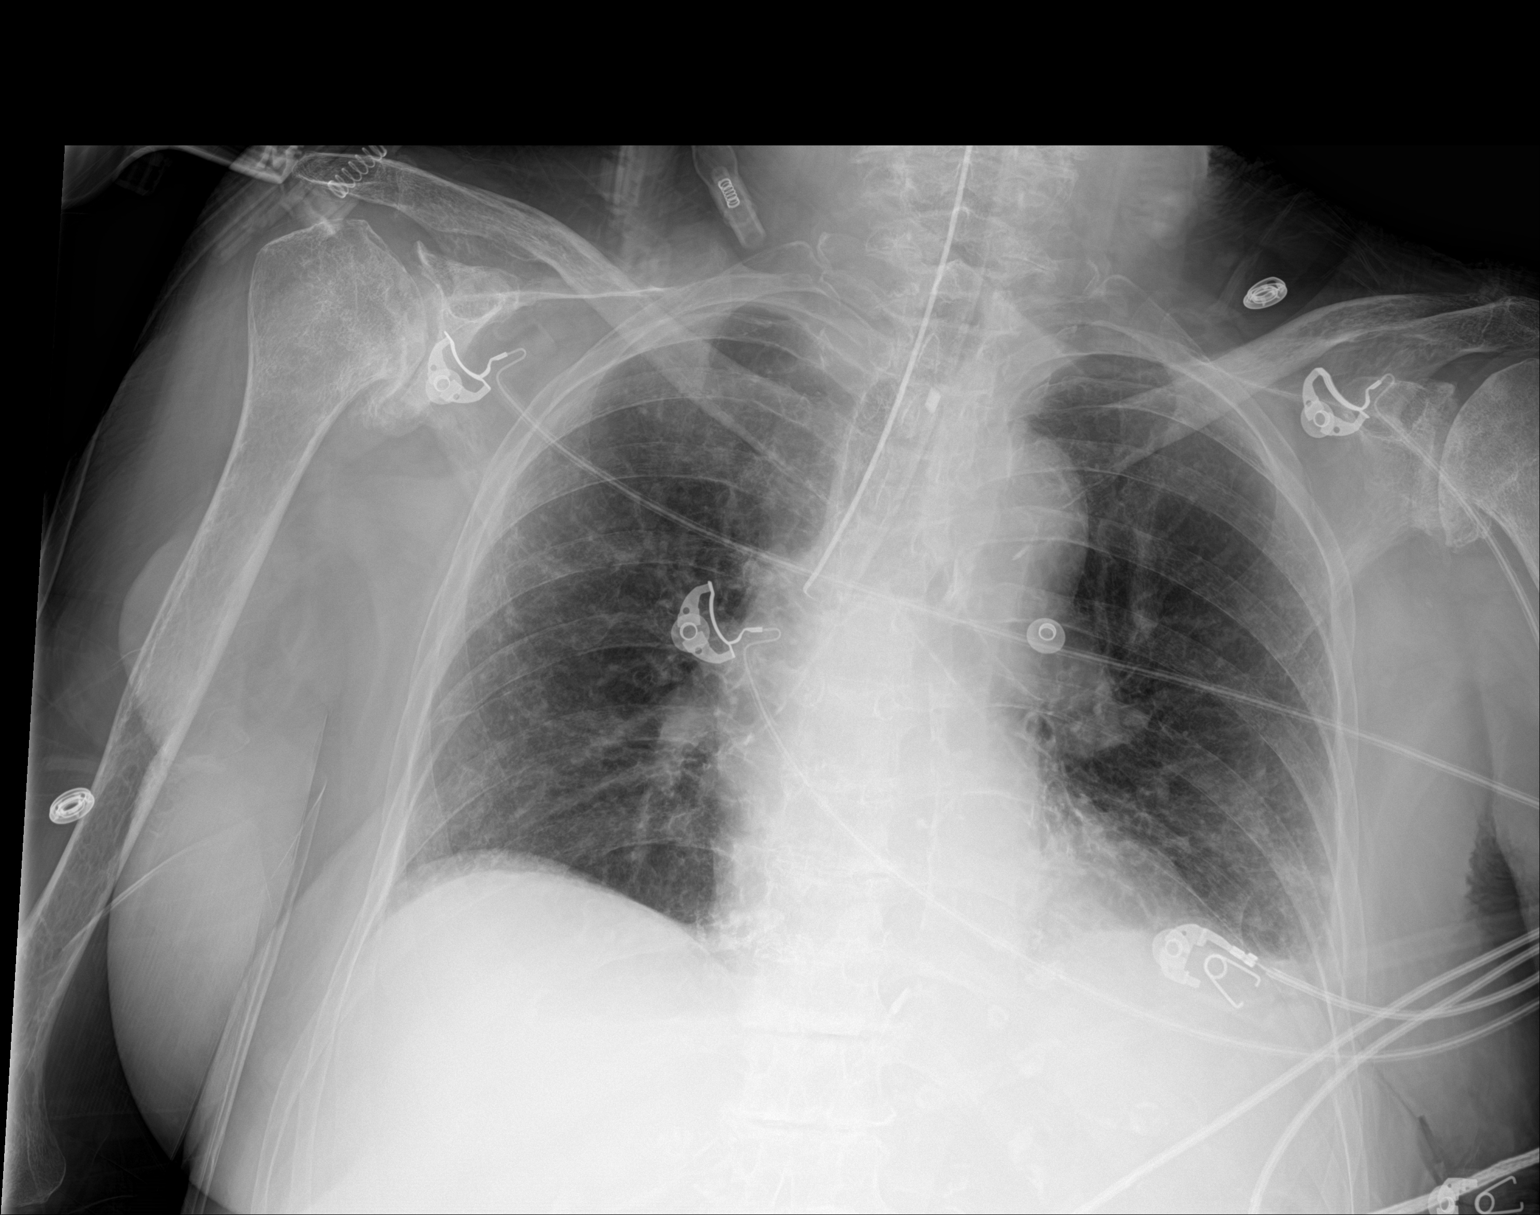

[1 of 1 positions shown; findings below may reference images not displayed]

FINDINGS: There has been interval intubation with endotracheal tube
terminating in the right mainstem bronchus. Retraction by 4.5 cm is
recommended.

Cardiomediastinal silhouette is normal. Mediastinal contours appear
intact. Calcific atherosclerotic disease of the aorta.

There is no evidence of focal airspace consolidation, pleural
effusion or pneumothorax. Low lung volumes. Chronic interstitial
lung changes.

Osseous structures are without acute abnormality. Avascular necrosis
of bilateral humeral heads again noted. Soft tissues are grossly
normal.
IMPRESSION: Interval right mainstem bronchus intubation. Retraction of the
endotracheal tube by 4.5 cm is recommended.

No evidence of pneumothorax.

Low lung volumes.

Chronic interstitial lung changes.

These results were called by telephone at the time of interpretation
acknowledged these results.

## 2017-12-26 IMAGING — CT CT CERVICAL SPINE W/O CM
4 of 5 series · 14 of 35 positions shown, 16 images · non-contrast
Comparison: Head CT 12/10/2016

CLINICAL DATA: Unresponsive.  Right thalamic hemorrhage.

EXAM:
CT CERVICAL SPINE WITHOUT CONTRAST
TECHNIQUE: Multidetector CT imaging of the cervical spine was performed without
intravenous contrast. Multiplanar CT image reconstructions were also
generated.

[Series 6: c_spine 2.0 sag bone · sagittal · 0.37mm/px · 5 of 71 slices shown, 6 images]
[im 24/71  bone]
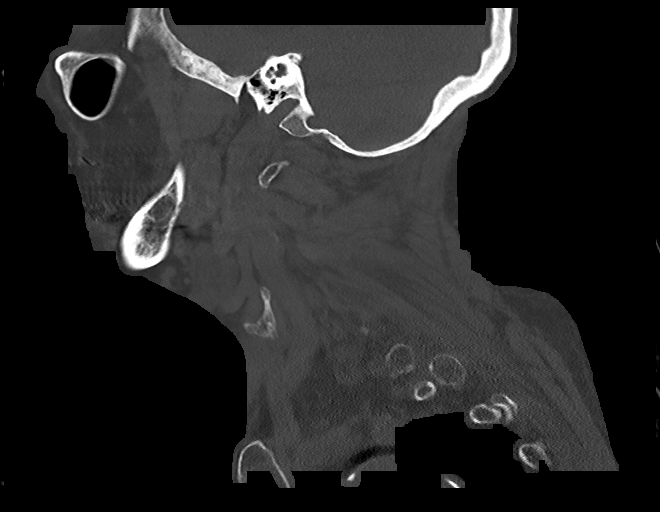
[im 30/71  bone]
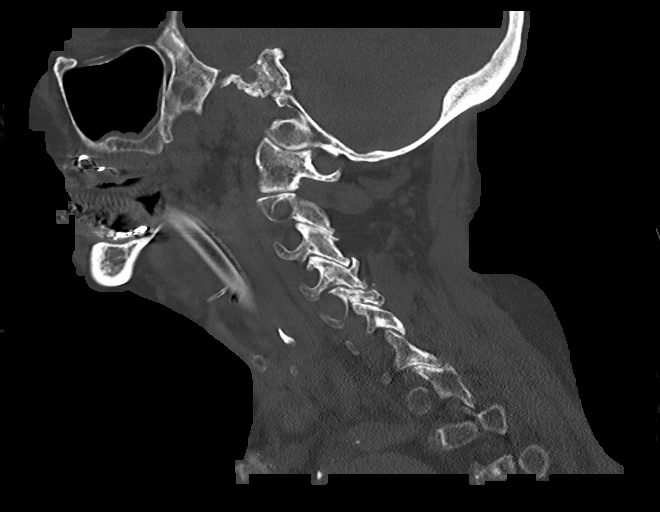
[im 36/71  soft-tissue]
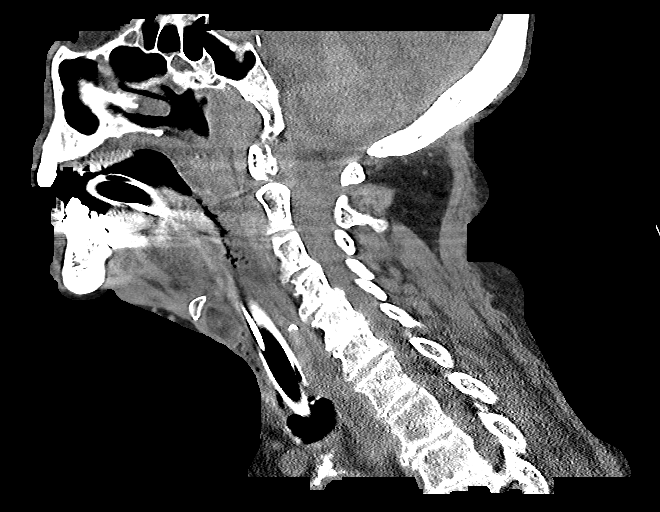
[im 36/71  bone]
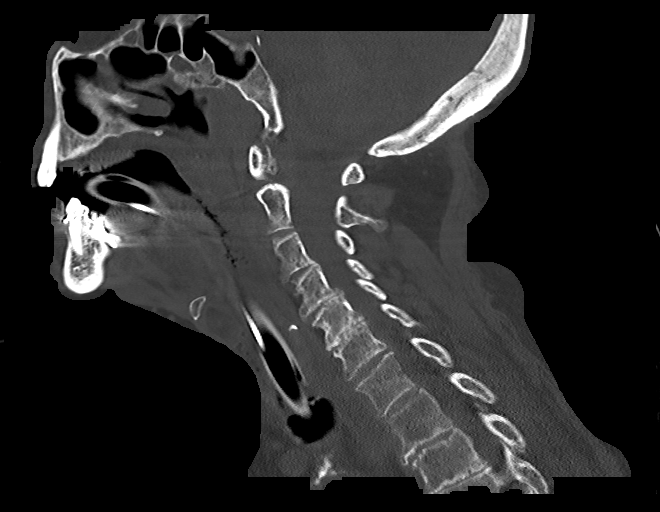
[im 41/71  bone]
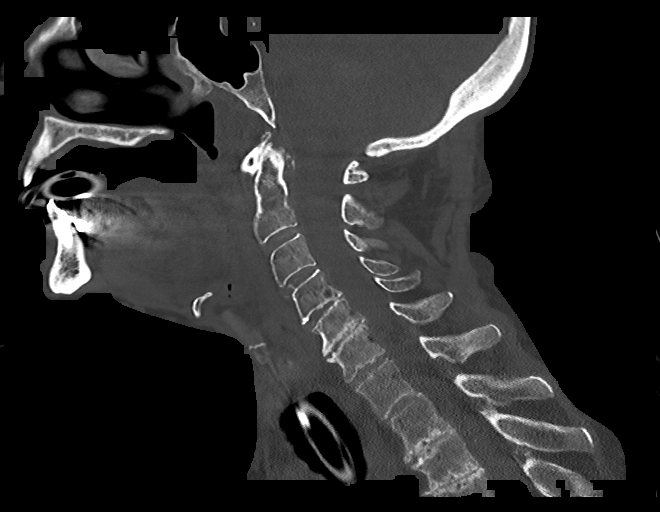
[im 47/71  bone]
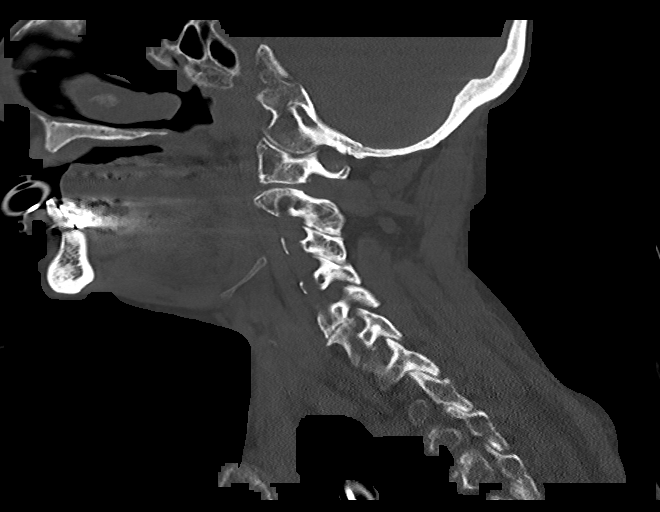

[Series 7: c_spine 2.0 cor bone · coronal · 0.26mm/px · 3 of 91 slices shown]
[im 19/91  bone]
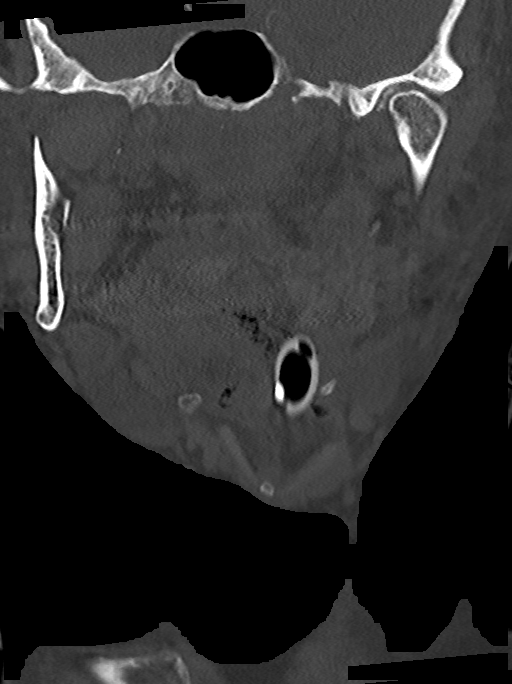
[im 37/91  bone]
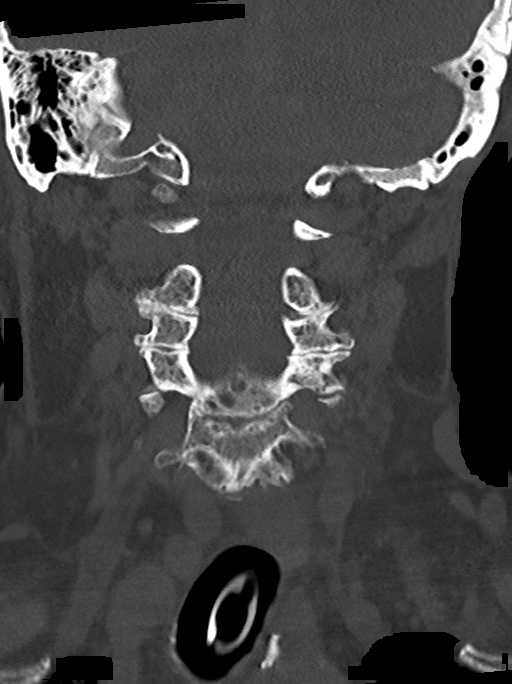
[im 55/91  bone]
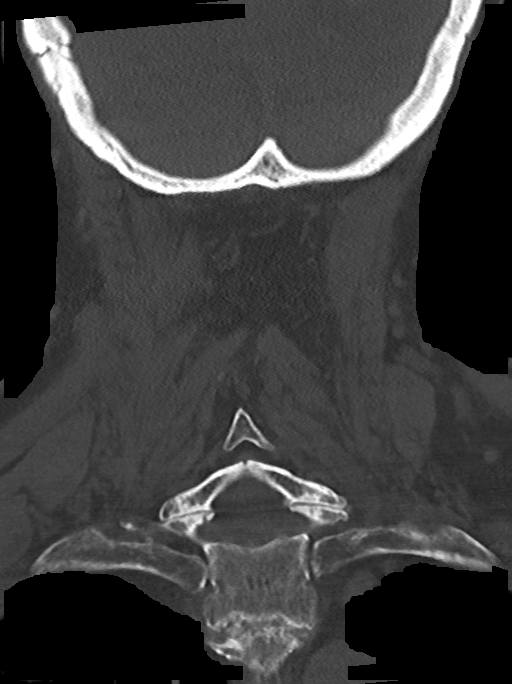

[Series 8: c_spine 2.0 orthogonals · oblique · 0.21mm/px · 3 of 85 slices shown, 4 images (1 of 2)]
[im 22/85  soft-tissue]
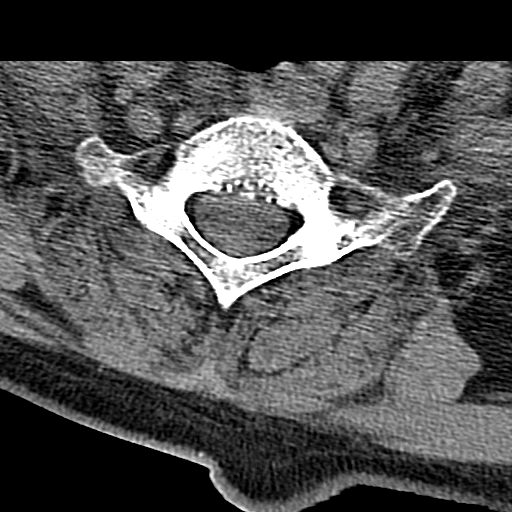
[im 22/85  bone]
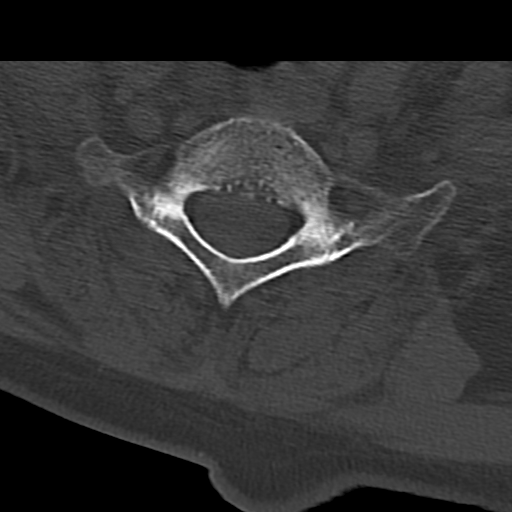
[im 43/85  bone]
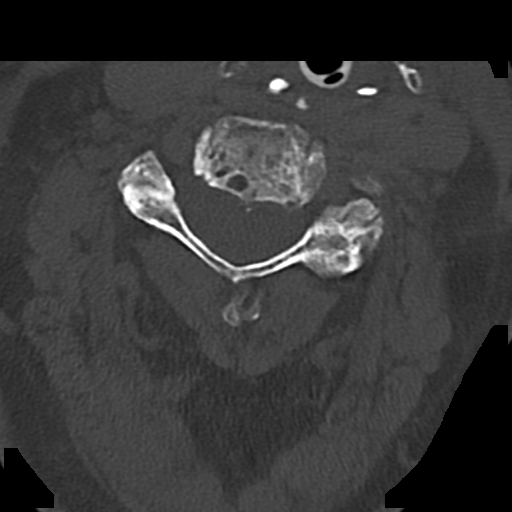
[im 64/85  bone]
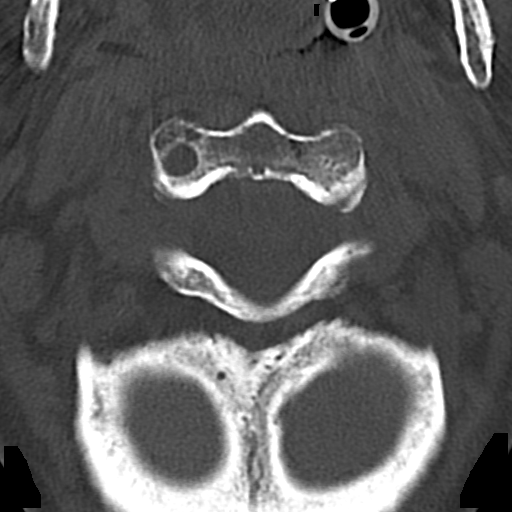

[Series 9: c_spine 2.0 orthogonals · oblique · 0.21mm/px · 3 of 85 slices shown (2 of 2)]
[im 22/85  bone]
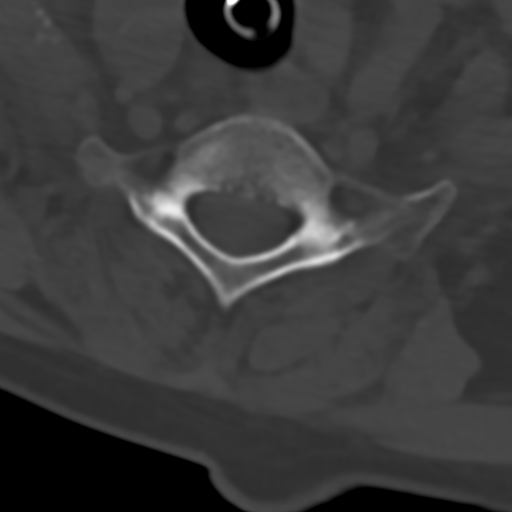
[im 43/85  bone]
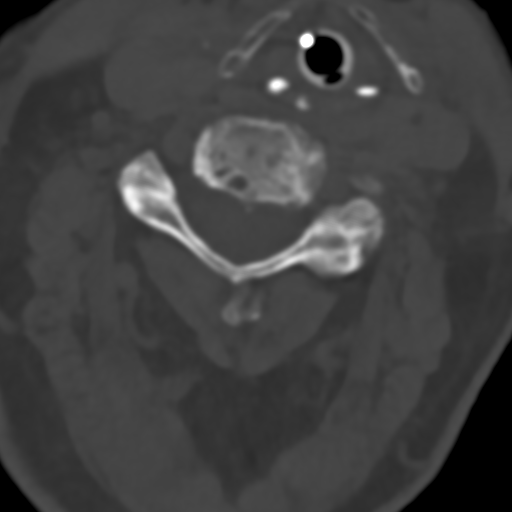
[im 64/85  bone]
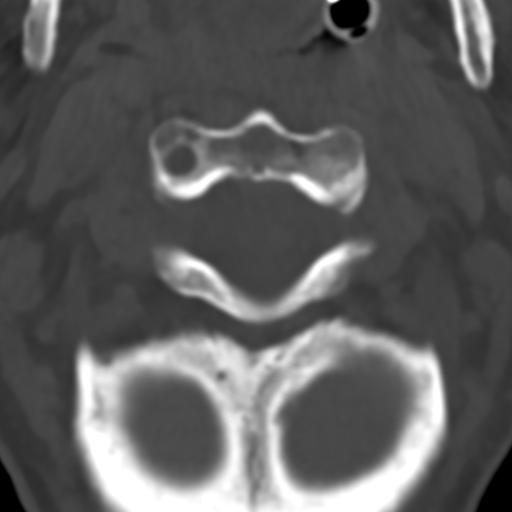

[14 of 35 positions shown; findings below may reference images not displayed]

FINDINGS: Alignment: Normal.

Skull base and vertebrae: No acute fracture. No primary bone lesion
or focal pathologic process.

Soft tissues and spinal canal: No prevertebral fluid or swelling. No
visible canal hematoma.

Disc levels: Multilevel osteoarthritic changes with disc space
narrowing, sclerotic remodeling of vertebral bodies. Mild posterior
facet arthropathy.

Upper chest: Negative.

Other: Known intracranial hemorrhage with blood products within the
fourth ventricle, and cerebellar peduncle, partially visualized.
IMPRESSION: No evidence of traumatic injury to the cervical spine.

Multilevel osteoarthritic changes.

Known intracranial hemorrhage.

## 2017-12-26 IMAGING — CT CT HEAD CODE STROKE
3 series · 16 of 47 positions shown, 19 images · non-contrast
Comparison: 11/11/2015

CLINICAL DATA: Code stroke. Left-sided paralysis. Unequal pupils.
Unresponsive.

EXAM:
CT HEAD WITHOUT CONTRAST
TECHNIQUE: Contiguous axial images were obtained from the base of the skull
through the vertex without intravenous contrast.

[Series 3: head 5.0 st · axial · 0.46mm/px · z∈[-77,+53]mm · 10 of 32 slices shown, 13 images]
[im 3/32  brain]
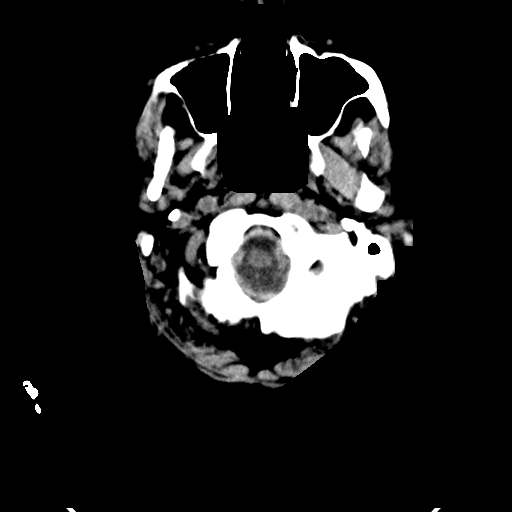
[im 3/32  bone]
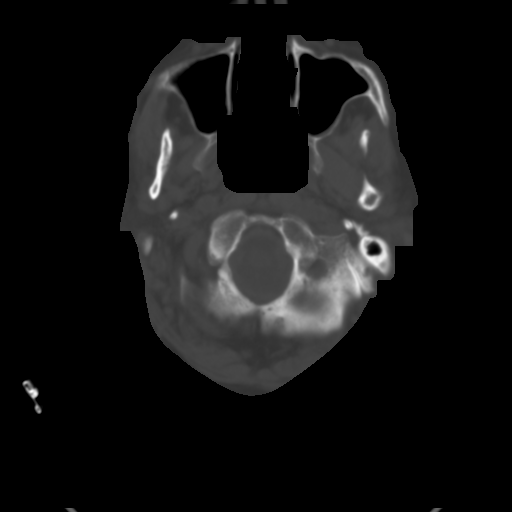
[im 6/32  brain]
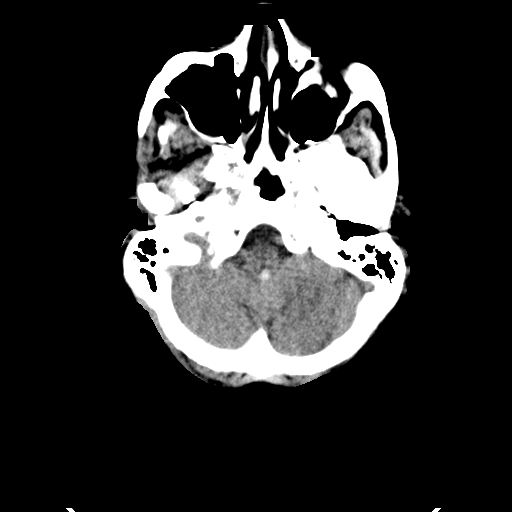
[im 9/32  brain]
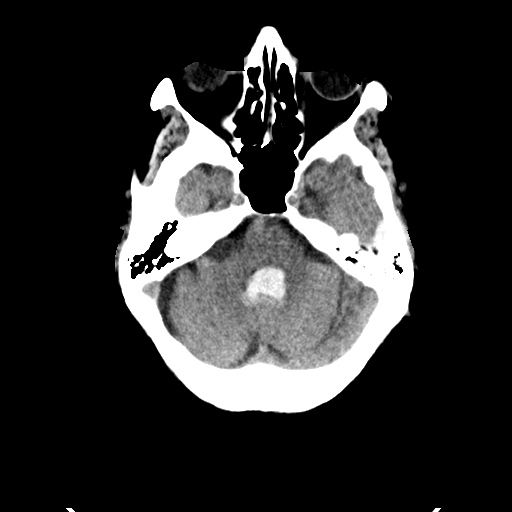
[im 11/32  brain]
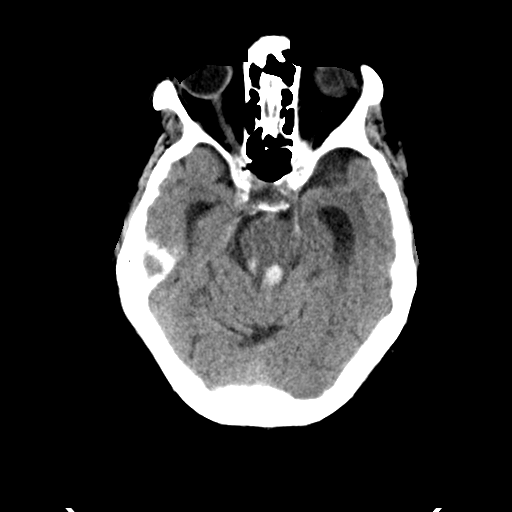
[im 14/32  brain]
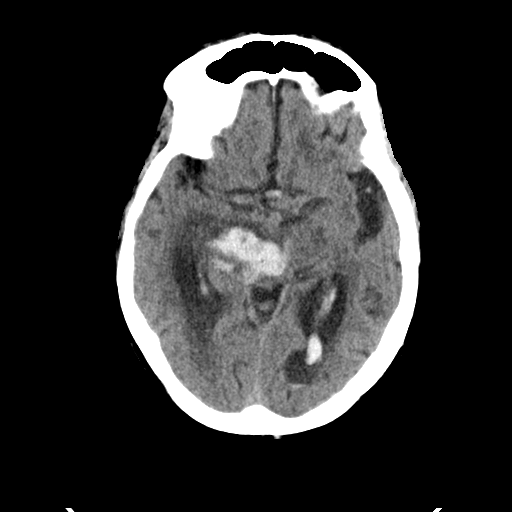
[im 14/32  bone]
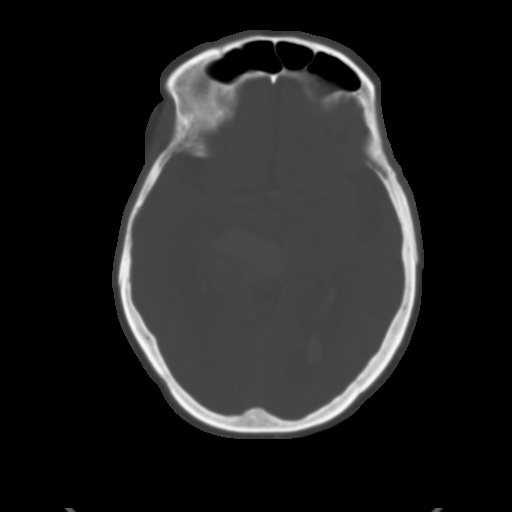
[im 18/32  brain]
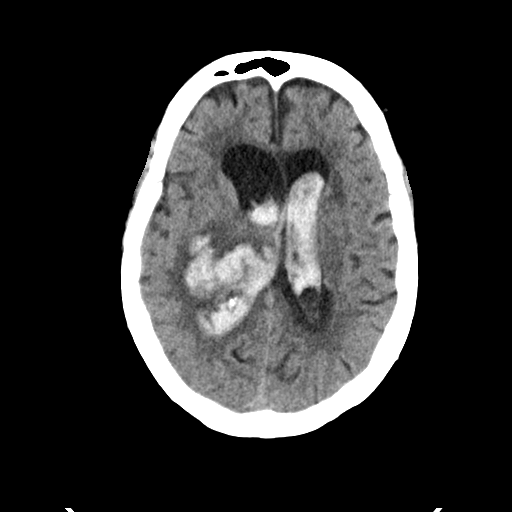
[im 21/32  brain]
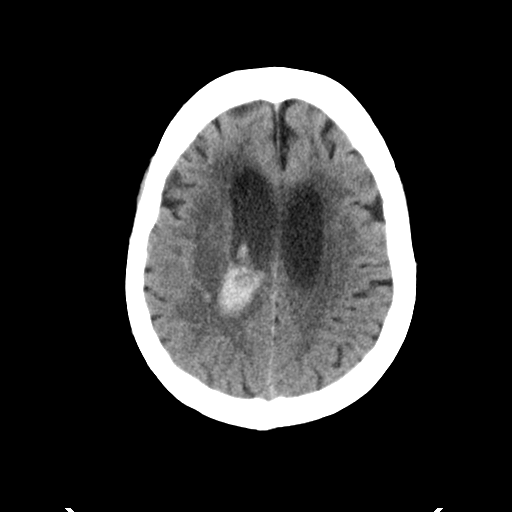
[im 24/32  brain]
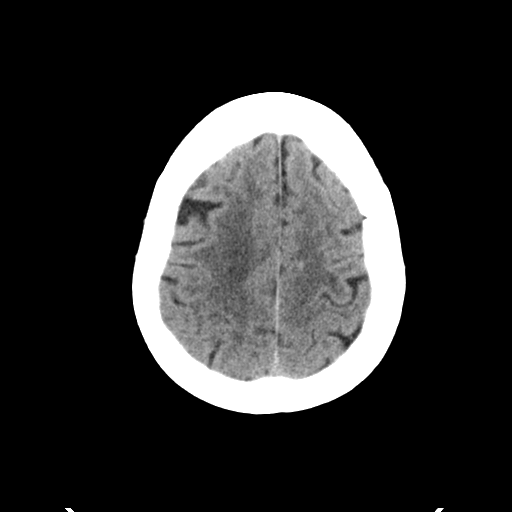
[im 26/32  brain]
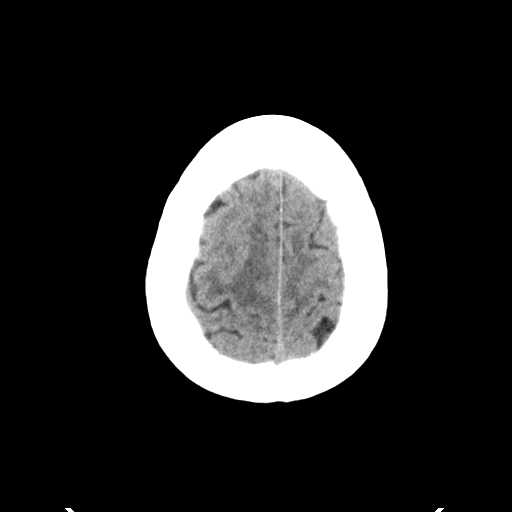
[im 26/32  bone]
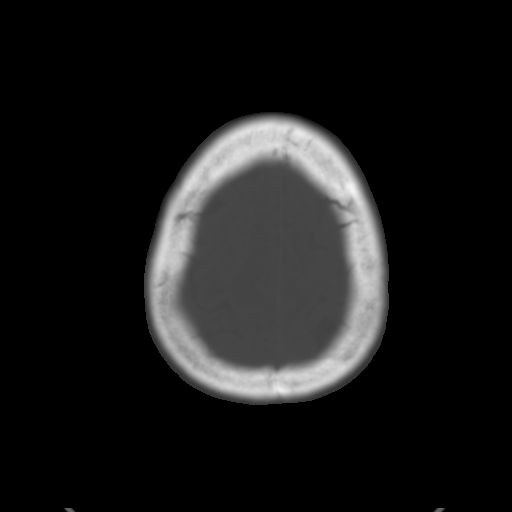
[im 29/32  brain]
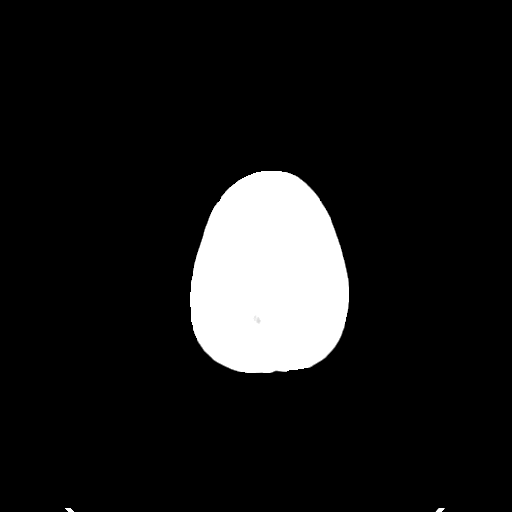

[Series 5: head 3.0 cor st · coronal · 0.34mm/px · 3 of 69 slices shown]
[im 23/69  brain]
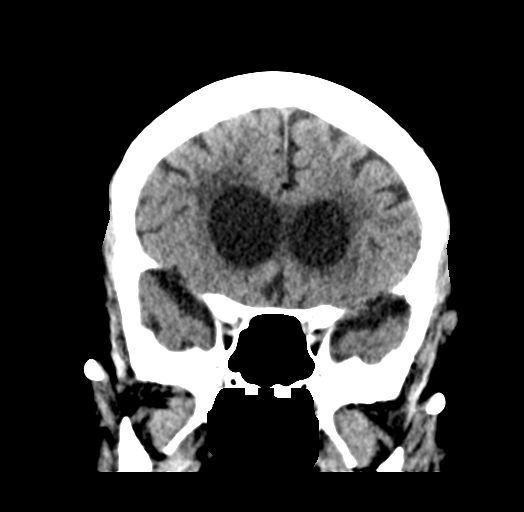
[im 31/69  brain]
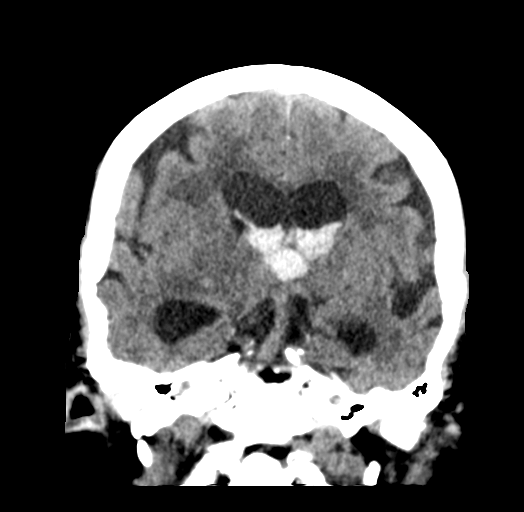
[im 38/69  brain]
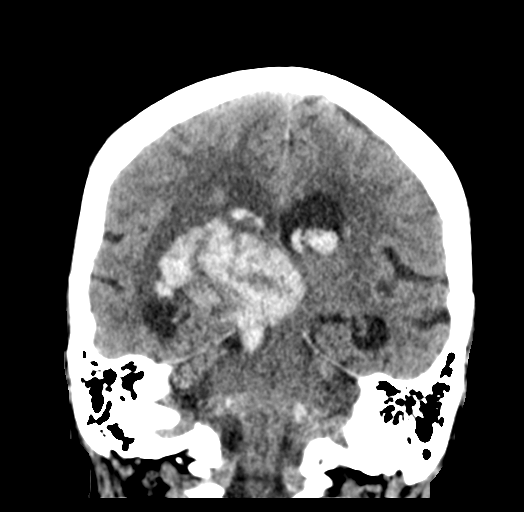

[Series 6: head 3.0 sag st · sagittal · 0.34mm/px · 3 of 55 slices shown]
[im 19/55  brain]
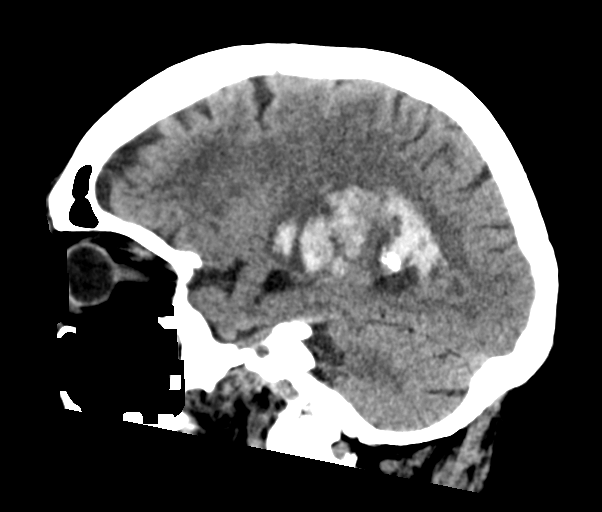
[im 28/55  brain]
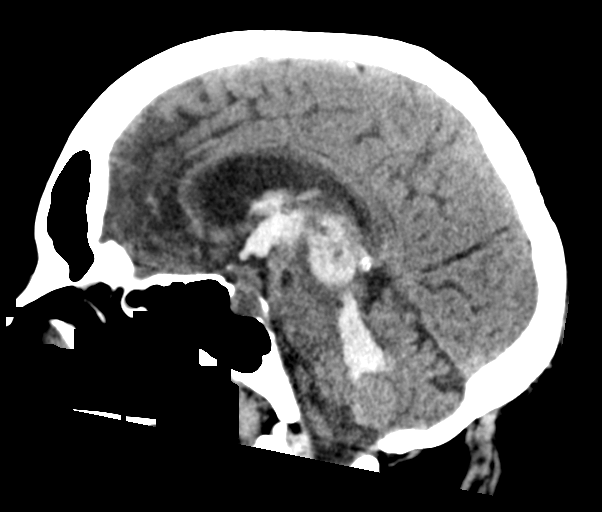
[im 37/55  brain]
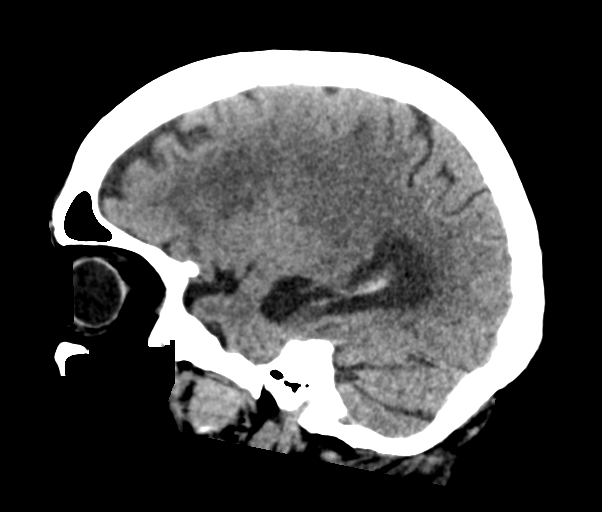

[16 of 47 positions shown; findings below may reference images not displayed]

FINDINGS: Brain: Acute right thalamic intraparenchymal hematoma measuring
x 3.2 x 3.3 cm (volume = 25 cm^3) with extension into the right
cerebral peduncle and mid brain and with intraventricular
penetration. Moderate amount of blood in the lateral ventricles.
Filling of the third ventricle. Large amount of blood in the fourth
ventricle. Possible acute ventricular enlargement. Right-to-left
midline shift of 4 mm. Background pattern elsewhere brain atrophy
and chronic small vessel disease.

Vascular: There is atherosclerotic calcification of the major
vessels at the base of the brain.

Skull: Normal

Sinuses/Orbits: Clear/ normal

Other: None significant
IMPRESSION: 1. Acute right thalamic intraparenchymal hemorrhage with hematoma
measuring 4.5 x 3.2 x 3.3 cm, showing extension into the right
cerebral peduncle and midbrain and intraventricular penetration,
probably with ventricular dilatation.
2. Critical Value/emergent results were called by telephone at the
time of interpretation on 12/10/2016 at [DATE] to Dr. Hartwig, who
verbally acknowledged these results.

## 2023-08-18 ENCOUNTER — Other Ambulatory Visit: Payer: Self-pay
